# Patient Record
Sex: Female | Born: 1965 | Race: Black or African American | Hispanic: No | Marital: Married | Smoking: Never smoker
Health system: Southern US, Community
[De-identification: ages and names within clinical notes are randomized; demographics above are authoritative.]

## PROBLEM LIST (undated history)

## (undated) DIAGNOSIS — M549 Dorsalgia, unspecified: Secondary | ICD-10-CM

## (undated) DIAGNOSIS — E785 Hyperlipidemia, unspecified: Principal | ICD-10-CM

## (undated) DIAGNOSIS — M1990 Unspecified osteoarthritis, unspecified site: Secondary | ICD-10-CM

## (undated) DIAGNOSIS — F32A Depression, unspecified: Secondary | ICD-10-CM

## (undated) DIAGNOSIS — K219 Gastro-esophageal reflux disease without esophagitis: Secondary | ICD-10-CM

## (undated) DIAGNOSIS — G629 Polyneuropathy, unspecified: Secondary | ICD-10-CM

## (undated) DIAGNOSIS — F329 Major depressive disorder, single episode, unspecified: Secondary | ICD-10-CM

## (undated) DIAGNOSIS — G8929 Other chronic pain: Secondary | ICD-10-CM

## (undated) HISTORY — DX: Unspecified osteoarthritis, unspecified site: M19.90

## (undated) HISTORY — PX: BREAST SURGERY: SHX581

## (undated) HISTORY — PX: ACHILLES TENDON SURGERY: SHX542

## (undated) HISTORY — PX: NEUROPLASTY / TRANSPOSITION MEDIAN NERVE AT CARPAL TUNNEL BILATERAL: SUR894

## (undated) HISTORY — PX: CHOLECYSTECTOMY: SHX55

## (undated) HISTORY — PX: ABDOMINAL HYSTERECTOMY: SHX81

## (undated) HISTORY — DX: Hyperlipidemia, unspecified: E78.5

---

## 2003-09-23 ENCOUNTER — Emergency Department (HOSPITAL_COMMUNITY): Admission: EM | Admit: 2003-09-23 | Discharge: 2003-09-24 | Payer: Self-pay | Admitting: Emergency Medicine

## 2005-03-26 ENCOUNTER — Emergency Department (HOSPITAL_COMMUNITY): Admission: EM | Admit: 2005-03-26 | Discharge: 2005-03-27 | Payer: Self-pay | Admitting: Emergency Medicine

## 2005-04-03 ENCOUNTER — Emergency Department (HOSPITAL_COMMUNITY): Admission: EM | Admit: 2005-04-03 | Discharge: 2005-04-03 | Payer: Self-pay | Admitting: Emergency Medicine

## 2005-07-27 ENCOUNTER — Encounter: Admission: RE | Admit: 2005-07-27 | Discharge: 2005-10-25 | Payer: Self-pay | Admitting: Orthopedic Surgery

## 2005-09-10 ENCOUNTER — Encounter: Admission: RE | Admit: 2005-09-10 | Discharge: 2005-09-10 | Payer: Self-pay | Admitting: Orthopedic Surgery

## 2007-07-21 ENCOUNTER — Emergency Department (HOSPITAL_COMMUNITY): Admission: EM | Admit: 2007-07-21 | Discharge: 2007-07-21 | Payer: Self-pay | Admitting: Emergency Medicine

## 2009-01-31 ENCOUNTER — Emergency Department (HOSPITAL_COMMUNITY): Admission: EM | Admit: 2009-01-31 | Discharge: 2009-01-31 | Payer: Self-pay | Admitting: Family Medicine

## 2011-01-18 ENCOUNTER — Inpatient Hospital Stay (HOSPITAL_COMMUNITY)
Admission: EM | Admit: 2011-01-18 | Discharge: 2011-01-21 | DRG: 392 | Disposition: A | Discharge status: Home / Self Care | Attending: Internal Medicine | Admitting: Internal Medicine

## 2011-01-18 ENCOUNTER — Emergency Department (HOSPITAL_COMMUNITY)

## 2011-01-18 DIAGNOSIS — R109 Unspecified abdominal pain: Secondary | ICD-10-CM | POA: Diagnosis present

## 2011-01-18 DIAGNOSIS — K2990 Gastroduodenitis, unspecified, without bleeding: Principal | ICD-10-CM | POA: Diagnosis present

## 2011-01-18 DIAGNOSIS — K5900 Constipation, unspecified: Secondary | ICD-10-CM | POA: Diagnosis present

## 2011-01-18 DIAGNOSIS — R7309 Other abnormal glucose: Secondary | ICD-10-CM | POA: Diagnosis present

## 2011-01-18 DIAGNOSIS — K2970 Gastritis, unspecified, without bleeding: Principal | ICD-10-CM | POA: Diagnosis present

## 2011-01-18 LAB — CBC
MCH: 28.1 pg (ref 26.0–34.0)
MCHC: 33.1 g/dL (ref 30.0–36.0)
MCV: 84.8 fL (ref 78.0–100.0)
Platelets: 202 10*3/uL (ref 150–400)
RDW: 13.2 % (ref 11.5–15.5)

## 2011-01-18 LAB — DIFFERENTIAL
Basophils Absolute: 0 10*3/uL (ref 0.0–0.1)
Eosinophils Relative: 0 % (ref 0–5)
Lymphocytes Relative: 13 % (ref 12–46)
Lymphs Abs: 0.9 10*3/uL (ref 0.7–4.0)
Monocytes Absolute: 0.3 10*3/uL (ref 0.1–1.0)
Neutrophils Relative %: 84 % — ABNORMAL HIGH (ref 43–77)

## 2011-01-18 LAB — LIPASE, BLOOD: Lipase: 10 U/L — ABNORMAL LOW (ref 11–59)

## 2011-01-18 LAB — GLUCOSE, CAPILLARY: Glucose-Capillary: 107 mg/dL — ABNORMAL HIGH (ref 70–99)

## 2011-01-18 LAB — COMPREHENSIVE METABOLIC PANEL
ALT: 25 U/L (ref 0–35)
CO2: 25 mEq/L (ref 19–32)
Calcium: 9 mg/dL (ref 8.4–10.5)
Creatinine, Ser: 0.67 mg/dL (ref 0.4–1.2)
GFR calc Af Amer: >60 mL/min (ref >60)
GFR calc non Af Amer: >60 mL/min (ref >60)
Glucose, Bld: 109 mg/dL — ABNORMAL HIGH (ref 70–99)
Potassium: 3.5 mEq/L (ref 3.5–5.1)

## 2011-01-18 LAB — URINALYSIS, ROUTINE W REFLEX MICROSCOPIC
Hgb urine dipstick: NEGATIVE
Leukocytes, UA: NEGATIVE
Nitrite: NEGATIVE

## 2011-01-18 LAB — BASIC METABOLIC PANEL
Calcium: 9.6 mg/dL (ref 8.4–10.5)
Potassium: 3.8 mEq/L (ref 3.5–5.1)
Sodium: 137 mEq/L (ref 135–145)

## 2011-01-18 LAB — POCT PREGNANCY, URINE: Preg Test, Ur: NEGATIVE

## 2011-01-18 MED ORDER — IOHEXOL 300 MG/ML  SOLN
100.0000 mL | Freq: Once | INTRAMUSCULAR | Status: AC | PRN
  Administered 2011-01-18: 100 mL via INTRAVENOUS

## 2011-01-19 LAB — COMPREHENSIVE METABOLIC PANEL
Albumin: 3.6 g/dL (ref 3.5–5.2)
BUN: 14 mg/dL (ref 6–23)
CO2: 22 mEq/L (ref 19–32)
Chloride: 106 mEq/L (ref 96–112)
Creatinine, Ser: 0.59 mg/dL (ref 0.4–1.2)
Potassium: 3.6 mEq/L (ref 3.5–5.1)

## 2011-01-19 LAB — CBC
HCT: 35.6 % — ABNORMAL LOW (ref 36.0–46.0)
Hemoglobin: 11.4 g/dL — ABNORMAL LOW (ref 12.0–15.0)
MCHC: 32 g/dL (ref 30.0–36.0)
MCV: 86.4 fL (ref 78.0–100.0)
RDW: 13.4 % (ref 11.5–15.5)
WBC: 5.8 10*3/uL (ref 4.0–10.5)

## 2011-01-19 LAB — PHOSPHORUS: Phosphorus: 2.6 mg/dL (ref 2.3–4.6)

## 2011-01-19 LAB — MAGNESIUM: Magnesium: 1.9 mg/dL (ref 1.5–2.5)

## 2011-01-19 LAB — DIFFERENTIAL
Basophils Absolute: 0 10*3/uL (ref 0.0–0.1)
Eosinophils Absolute: 0 10*3/uL (ref 0.0–0.7)
Eosinophils Relative: 0 % (ref 0–5)
Lymphocytes Relative: 31 % (ref 12–46)
Lymphs Abs: 1.8 10*3/uL (ref 0.7–4.0)
Neutrophils Relative %: 58 % (ref 43–77)

## 2011-01-19 LAB — HEMOGLOBIN A1C
Hgb A1c MFr Bld: 5.5 % (ref <5.7)
Mean Plasma Glucose: 111 mg/dL (ref <117)

## 2011-01-30 NOTE — H&P (Signed)
[name], [name]                ACCOUNT NO.:  [id number]  MEDICAL RECORD NO.:  [id number]  LOCATION:  WLED                         FACILITY:  [facility]  PHYSICIAN:  Kathlen Mody, MD       DATE OF BIRTH:  1965-11-16  DATE OF ADMISSION:  01/18/2011 DATE OF DISCHARGE:                             HISTORY & PHYSICAL   PRIMARY CARE PHYSICIAN:  Tawni Millers, M.D.  CHIEF COMPLAINT:  Persistent nausea, vomiting, abdominal pain, and diarrhea since 1 day.  HISTORY OF PRESENT ILLNESS:  This is a 45 year old lady with no significant past medical history, came in this morning to [facility] ED complaining of nausea, vomiting, abdominal pain, and loose bowel movements since yesterday 3 p.m.  There is no history of hematemesis. No history of any bleeding per rectum.  Bowel movements are loose, not watery.  No history of fever.  No history of sick contacts.  The patient works at [employer] and does not report anybody sick at [employer]. The patient denies any history of travel.  Denies history of sick contacts.  The patient reports vomiting persistent watery yellowish to green, no hematemesis.  No history of cough, chest pain, shortness of breath, palpitations, or syncope.  No history of blurry vision.  History of occasional headaches since yesterday.  No history of any tingling or numbness anywhere in the body.  The patient had a CT abdomen and pelvis done in the ER, which was within normal limits.  The patient could not tolerate any p.o. intake and hospitalist service has been asked to admit the patient for observation and symptom control.  REVIEW OF SYSTEMS:  See HPI, otherwise negative.  PAST MEDICAL HISTORY:  None.  HOME MEDICATIONS:  Vitamin D tablet 1 tablet daily.  FAMILY HISTORY:  History of diabetes and hypertension in parents.  ALLERGIES:  Allergic to PERCOCET.  SOCIAL HISTORY:  The patient denies any smoking, EtOH, or recreational drug use.  The patient works at [employer].  PHYSICAL EXAMINATION:  VITAL SIGNS:  She is afebrile, temperature of 98.4, pulse of 84, respiratory rate 16, blood pressure 106/52, and saturating 98% on room air. GENERAL:  She is alert, afebrile, tired looking, comfortable, in no acute distress. HEENT:  Pupils are equal reacting to light.  No JVD.  Dry mucous membranes. CARDIOVASCULAR:  S1 and S2 heard.  Regular rate and rhythm. RESPIRATORY:  Chest is clear to auscultation bilaterally.  No wheezing or rhonchi. ABDOMEN:  Soft, mildly tender in the epigastric area.  Bowel sounds are heard.  No organomegaly felt. EXTREMITIES:  No pedal edema. NEUROLOGICAL:  The patient is able to move all extremities.  The patient is alert and oriented x3.  No focal deficits.  PERTINENT LABORATORY DATA:  The patient had a urine pregnancy test, which was negative.  Urinalysis shows negative nitrites or leukocyte. CBC within normal limits.  Basic metabolic panel significant for a glucose of 118.  RADIOLOGY:  The patient had a CT abdomen and pelvis, which shows post cholecystectomy and hysterectomy and simple left ovarian cyst.  No acute or specific findings.  ASSESSMENT AND PLAN:  This is a 44 year old lady with no significant  past medical history, works at [employer], is being admitted for: 1. Persistent nausea, vomiting, lower abdominal pain and epigastric     pain and loose bowel movements most likely viral gastroenteritis.     She will be admitted for symptom control.  We will start her on IV     fluids normal saline 100 cc/hour, IV Zofran 4 mg q.8 h. as needed.     Clear liquid diet now as she tolerates diet.  We will advance the     diet later tomorrow.  Pain medication with Tylenol as needed.  We     will also start the patient on p.o. Protonix.  We will send stool     for stool fecal WBCs to see if she has an infection. 2. Hyperglycemia.  She has a blood sugar of 118.  We will send a     hemoglobin A1c. 3. For deep venous  thrombosis prophylaxis, we will encourage the     patient to be ambulatory, to ambulate. 4. The patient is full code.          ______________________________ Kathlen Mody, MD     VA/MEDQ  D:  01/18/2011  T:  01/18/2011  Job:  [id number]  Electronically Signed by Kathlen Mody MD on 01/30/2011 02:28:17 AM

## 2011-01-30 NOTE — Discharge Summary (Signed)
  [name], [name]                ACCOUNT NO.:  [id number]  MEDICAL RECORD NO.:  [id number]  LOCATION:  1504                         FACILITY:  [facility]  PHYSICIAN:  Kathlen Mody, MD       DATE OF BIRTH:  16-Aug-1965  DATE OF ADMISSION:  01/18/2011 DATE OF DISCHARGE:  01/21/2011                              DISCHARGE SUMMARY   DISCHARGE DIAGNOSES: 1. Gastritis. 2. Constipation.  DISCHARGE MEDICATIONS: 1. Protonix 40 mg 1 tablet daily. 2. Tylenol 325 mg take 650 mg p.o. q.4 h. 3. Promethazine 12.5 mg tablet half a tablet every 6 hours as needed. 4. Vitamin D3 one tablet daily.  PERTINENT LABORATORY DATA:  On admission, the patient had a urine pregnancy test which was negative.  Urinalysis negative for nitrites and leukocytes.  CBC within normal limits.  Basic metabolic panel significant for a glucose of 118.  Comprehensive metabolic panel with LFTs within normal limits.  Lipase of 10.  Repeat CBC showed a hemoglobin of 11.4.  Comprehensive metabolic panel once again within normal limits.  TSH was 1.342, hemoglobin A1c was 5.5, phos was 2.6, mag was 1.9.  CT abdomen and pelvis shows features of post cholecystectomy and hysterectomy, simple left ovarian cyst.  No acute or specific findings.  CONSULTS:  None.  BRIEF HOSPITAL COURSE:  The patient is a 45 year old lady with no significant past medical history, admitted for episodes of nausea, vomiting and loose bowel movements.  On admission, she appeared dehydrated.  She was given IV fluids, IV Zofran/Phenergan as needed and she was also started on Protonix.  The patient's abdominal pain has improved.  Nausea and vomiting has resolved.  The patient did not have any bowel movements during her hospitalization.  So the patient is being discharged on p.o. Protonix 40 mg daily for about 4 weeks and Phenergan as needed for nausea.  Hyperglycemia.  She was found to have a blood sugar of 118, hemoglobin A1c was 5.5 and the patient is  not diabetic.  DISCHARGE PHYSICAL EXAMINATION:  VITAL SIGNS:  The patient's vitals on the day of discharge, temperature of 98.2, pulse of 61, blood pressure 112/66, saturating 96% on room air. GENERAL:  On exam, she is alert, afebrile, oriented x3, comfortable. CARDIOVASCULAR EXAM:  S1, S2 heard. RESPIRATORY EXAM:  Chest clear to auscultation bilaterally. ABDOMEN:  Soft, nontender, nondistended.  Bowel sounds heard. EXTREMITIES:  No pedal edema.  The patient is hemodynamically stable for discharge home.  FOLLOWUP AND RECOMMENDATIONS:  The patient is recommended to follow up with her PCP in about a week and also recommended to be referred to her gastroenterologist for possible outpatient upper endoscopy.  Until that time she is also recommended to avoid NSAIDS, over the counter ibuprofen, Aleve, naproxen, Advil.  She is also recommended to avoid caffeinated drinks.  DIET:  As tolerated.          ______________________________ Kathlen Mody, MD     VA/MEDQ  D:  01/21/2011  T:  01/21/2011  Job:  [id number]  Electronically Signed by Kathlen Mody MD on 01/30/2011 02:28:20 AM

## 2011-03-29 ENCOUNTER — Inpatient Hospital Stay (INDEPENDENT_AMBULATORY_CARE_PROVIDER_SITE_OTHER)
Admission: RE | Admit: 2011-03-29 | Discharge: 2011-03-29 | Disposition: A | Discharge status: Home / Self Care | Source: Ambulatory Visit | Attending: Emergency Medicine | Admitting: Emergency Medicine

## 2011-03-29 DIAGNOSIS — J019 Acute sinusitis, unspecified: Secondary | ICD-10-CM

## 2011-03-29 DIAGNOSIS — G44209 Tension-type headache, unspecified, not intractable: Secondary | ICD-10-CM

## 2011-03-29 DIAGNOSIS — J3489 Other specified disorders of nose and nasal sinuses: Secondary | ICD-10-CM

## 2011-11-15 LAB — HM PAP SMEAR: HM Pap smear: NORMAL

## 2012-08-17 ENCOUNTER — Emergency Department (HOSPITAL_COMMUNITY)
Admission: EM | Admit: 2012-08-17 | Discharge: 2012-08-17 | Disposition: A | Discharge status: Home / Self Care | Attending: Emergency Medicine | Admitting: Emergency Medicine

## 2012-08-17 DIAGNOSIS — R1110 Vomiting, unspecified: Secondary | ICD-10-CM | POA: Insufficient documentation

## 2012-08-17 DIAGNOSIS — K0889 Other specified disorders of teeth and supporting structures: Secondary | ICD-10-CM

## 2012-08-17 DIAGNOSIS — M542 Cervicalgia: Secondary | ICD-10-CM | POA: Insufficient documentation

## 2012-08-17 DIAGNOSIS — J029 Acute pharyngitis, unspecified: Secondary | ICD-10-CM | POA: Insufficient documentation

## 2012-08-17 DIAGNOSIS — K089 Disorder of teeth and supporting structures, unspecified: Secondary | ICD-10-CM | POA: Insufficient documentation

## 2012-08-17 MED ORDER — PENICILLIN V POTASSIUM 500 MG PO TABS: 500.0000 mg | ORAL_TABLET | Freq: Four times a day (QID) | ORAL | Status: AC

## 2012-08-17 MED ORDER — TRAMADOL HCL 50 MG PO TABS: 50.0000 mg | ORAL_TABLET | Freq: Four times a day (QID) | ORAL | Status: DC | PRN

## 2012-08-17 NOTE — ED Notes (Signed)
Pt states she has a ride home. 

## 2012-08-17 NOTE — ED Notes (Signed)
Pt c/o pain to lower L side of mouth. Pt with no acute distress. Pt states she has a dentist appt on Monday to have tooth pulled.

## 2012-08-17 NOTE — ED Provider Notes (Signed)
History  This chart was scribed for non-physician practitioner working with Raeford Razor, MD by Erskine Emery, ED Scribe. This patient was seen in room WTR7/WTR7 and the patient's care was started at 15:08.   CSN: [id number]  Arrival date & time 08/17/12  1426   First MD Initiated Contact with Patient 08/17/12 1508      No chief complaint on file.   (Consider location/radiation/quality/duration/timing/severity/associated sxs/prior Treatment) [name] is a 47 y.o. female who presents to the Emergency Department complaining of constant right-sided lower tooth pain since Thursday. Patient is a 47 y.o. female presenting with tooth pain. The history is provided by the patient. No language interpreter was used.  Dental PainThe primary symptoms include mouth pain and sore throat. Primary symptoms do not include dental injury, fever, shortness of breath or cough. The symptoms began 2 days ago. The symptoms are unchanged. The symptoms are new. The symptoms occur constantly.  The sore throat is not accompanied by trouble swallowing.  Additional symptoms do not include: gum swelling, gum tenderness, trismus, facial swelling and trouble swallowing.  Pt denies any associated fever, chills, right-sided facial swelling, difficulty swallowing, or decreased ROM of her neck but she reports some throat soreness, neck pain, and emesis associated with taking her husband's hydrocodone for the pain. Pt denies any tooth injury but reports she had a filling in that area about 2 years ago.  Pt has an appointment with her dentist on Monday to get the tooth pulled, she just needs some help with pain management for the weekend.  No past medical history on file.  No past surgical history on file.  No family history on file.  History  Substance Use Topics  . Smoking status: Not on file  . Smokeless tobacco: Not on file  . Alcohol Use: Not on file    OB History    No data available      Review of Systems    Constitutional: Negative for fever and chills.  HENT: Positive for sore throat, neck pain and dental problem. Negative for facial swelling, trouble swallowing and neck stiffness.   Respiratory: Negative for cough and shortness of breath.   Gastrointestinal: Negative for nausea and vomiting.  Neurological: Negative for weakness.    Allergies  Darvocet and Percocet  Home Medications   Current Outpatient Rx  Name  Route  Sig  Dispense  Refill  . VITAMIN D 1000 UNITS PO TABS   Oral   Take 1,000 Units by mouth daily.           There were no vitals taken for this visit.  Physical Exam  Nursing note and vitals reviewed. Constitutional: She is oriented to person, place, and time. She appears well-developed and well-nourished. No distress.  HENT:  Head: Normocephalic and atraumatic.  Mouth/Throat: Oropharynx is clear and moist. No oropharyngeal exudate.       No gingival swelling. No drainage. No tenderness to palpation of the gingiva. Tooth appears to be intact. No pulp or dentin exposed. Oropharynx is clear. No trismus. No facial swelling.  Eyes: EOM are normal. Pupils are equal, round, and reactive to light.  Neck: Neck supple. No tracheal deviation present.       No submental or submandibular lymphadenopathy. Full ROM of neck.  Cardiovascular: Normal rate, regular rhythm and normal heart sounds.   Pulmonary/Chest: Effort normal and breath sounds normal. No respiratory distress. She has no wheezes.  Abdominal: Soft. She exhibits no distension.  Musculoskeletal: Normal range of motion. She  exhibits no edema.  Neurological: She is alert and oriented to person, place, and time.  Skin: Skin is warm and dry.  Psychiatric: She has a normal mood and affect.    ED Course  Procedures (including critical care time) DIAGNOSTIC STUDIES: Oxygen Saturation is 99% on room air, normal by my interpretation.    COORDINATION OF CARE: 15:17--I evaluated the patient and we discussed a  treatment plan including ultram for pain to which the pt agreed.    Labs Reviewed - No data to display No results found.   No diagnosis found.    MDM  Patient with toothache.  No gross abscess.  Exam unconcerning for Ludwig's angina or spread of infection.  Will treat with penicillin and pain medicine.  Urged patient to follow-up with dentist.    I personally performed the services described in this documentation, which was scribed in my presence. The recorded information has been reviewed and is accurate.    Pascal Lux [place], PA-C 08/17/12 1530

## 2012-08-19 NOTE — ED Provider Notes (Signed)
Medical screening examination/treatment/procedure(s) were performed by non-physician practitioner and as supervising physician I was immediately available for consultation/collaboration.  Raeford Razor, MD 08/19/12 2104

## 2013-01-31 ENCOUNTER — Emergency Department (INDEPENDENT_AMBULATORY_CARE_PROVIDER_SITE_OTHER)
Admission: EM | Admit: 2013-01-31 | Discharge: 2013-01-31 | Disposition: A | Discharge status: Home / Self Care | Source: Home / Self Care | Attending: Family Medicine | Admitting: Family Medicine

## 2013-01-31 ENCOUNTER — Encounter (HOSPITAL_COMMUNITY): Payer: Self-pay | Admitting: Emergency Medicine

## 2013-01-31 DIAGNOSIS — J069 Acute upper respiratory infection, unspecified: Secondary | ICD-10-CM

## 2013-01-31 MED ORDER — IBUPROFEN 600 MG PO TABS: 600.0000 mg | ORAL_TABLET | Freq: Three times a day (TID) | ORAL | Status: DC | PRN

## 2013-01-31 MED ORDER — GUAIFENESIN-CODEINE 100-10 MG/5ML PO SYRP: 5.0000 mL | ORAL_SOLUTION | Freq: Three times a day (TID) | ORAL | Status: DC | PRN

## 2013-01-31 MED ORDER — CETIRIZINE-PSEUDOEPHEDRINE ER 5-120 MG PO TB12: 1.0000 | ORAL_TABLET | Freq: Two times a day (BID) | ORAL | Status: DC | PRN

## 2013-01-31 MED ORDER — BENZONATATE 100 MG PO CAPS: 100.0000 mg | ORAL_CAPSULE | Freq: Three times a day (TID) | ORAL | Status: DC

## 2013-01-31 NOTE — ED Notes (Signed)
Pt c/o cold/sinus sxs onset Wednesday... sxs include: ST, bilateral ear fullness, nasal congestion, cough w/streaks of blood, loss of voice, diarrhea... Denies: f/v/n, SOB, wheezing... Hx of sinus infections... She is alert and oriented w/no signs of acute distress.

## 2013-01-31 NOTE — ED Provider Notes (Signed)
History    CSN: [id number] Arrival date & time 01/31/13  1022  First MD Initiated Contact with Patient 01/31/13 1036     Chief Complaint  Patient presents with  . URI   (Consider location/radiation/quality/duration/timing/severity/associated sxs/prior Treatment) HPI Comments: 47 year old nondiabetic nonsmoker female here complaining of sore throat nasal congestion and general malaise for 3 days. Symptoms associated also with mild diarrhea on day #1. Also reports minimally productive frequent cough that presented blood strikes this morning. Denies shortness of breath wheezing or chest pain. No fever or chills. Appetite is okay. No headaches  History reviewed. No pertinent past medical history. History reviewed. No pertinent past surgical history. No family history on file. History  Substance Use Topics  . Smoking status: Never Smoker   . Smokeless tobacco: Not on file  . Alcohol Use: No   OB History   Grav Para Term Preterm Abortions TAB SAB Ect Mult Living                 Review of Systems  Constitutional: Negative for fever, chills, diaphoresis, appetite change and fatigue.  HENT: Positive for congestion, sore throat and voice change. Negative for trouble swallowing.   Respiratory: Positive for cough. Negative for shortness of breath and wheezing.   Gastrointestinal: Positive for diarrhea. Negative for nausea, vomiting and abdominal pain.  Skin: Negative for rash.  Neurological: Negative for dizziness and headaches.  All other systems reviewed and are negative.    Allergies  Darvocet; Latex; and Percocet  Home Medications   Current Outpatient Rx  Name  Route  Sig  Dispense  Refill  . cholecalciferol (VITAMIN D) 1000 UNITS tablet   Oral   Take 1,000 Units by mouth daily.         Marland Kitchen SIMVASTATIN PO   Oral   Take by mouth.         . benzonatate (TESSALON) 100 MG capsule   Oral   Take 1 capsule (100 mg total) by mouth every 8 (eight) hours.   21 capsule    0   . cetirizine-pseudoephedrine (ZYRTEC-D) 5-120 MG per tablet   Oral   Take 1 tablet by mouth 2 (two) times daily as needed for allergies or rhinitis.   30 tablet   0   . guaiFENesin-codeine (ROBITUSSIN AC) 100-10 MG/5ML syrup   Oral   Take 5 mLs by mouth 3 (three) times daily as needed for cough.   120 mL   0   . ibuprofen (ADVIL,MOTRIN) 600 MG tablet   Oral   Take 1 tablet (600 mg total) by mouth every 8 (eight) hours as needed for pain or fever. Take with food   30 tablet   0    BP 133/83  Pulse 79  Temp(Src) 98.2 F (36.8 C) (Oral)  Resp 17  SpO2 98% Physical Exam  Nursing note and vitals reviewed. Constitutional: She is oriented to person, place, and time. She appears well-developed and well-nourished. No distress.  HENT:  Mouth/Throat: Oropharynx is clear and moist. No oropharyngeal exudate.  Nasal Congestion with erythema and swelling of nasal turbinates, clear rhinorrhea. Mild pharyngeal erythema and swelling, no exudates. No uvula deviation. No trismus. TM's with increased vascular markings and clear fluid behind no dullness bilaterally no swelling or bulging  Eyes: Conjunctivae and EOM are normal. Pupils are equal, round, and reactive to light. Right eye exhibits no discharge. Left eye exhibits no discharge.  Neck: Neck supple.  Cardiovascular: Normal heart sounds.   Pulmonary/Chest: Breath sounds  normal.  Abdominal: Soft. There is no tenderness.  Lymphadenopathy:    She has no cervical adenopathy.  Neurological: She is alert and oriented to person, place, and time.  Skin: No rash noted. She is not diaphoretic.    ED Course  Procedures (including critical care time) Labs Reviewed  POCT RAPID STREP A ([facility] CARE ONLY)   No results found. 1. URI (upper respiratory infection)     MDM  Negative strep test. Prescribed Tessalon Perles, ibuprofen, cetirizine/pseudoephedrine and guaifenesin with codeine. Supportive care and red flags that should prompt  her return to medical attention discussed with patient and provided in writing.  Sharin Grave, MD 02/01/13 [id number]

## 2013-02-02 LAB — CULTURE, GROUP A STREP

## 2013-04-11 ENCOUNTER — Emergency Department (HOSPITAL_COMMUNITY): Payer: No Typology Code available for payment source

## 2013-04-11 ENCOUNTER — Emergency Department (HOSPITAL_COMMUNITY)
Admission: EM | Admit: 2013-04-11 | Discharge: 2013-04-11 | Disposition: A | Discharge status: Home / Self Care | Payer: No Typology Code available for payment source | Attending: Emergency Medicine | Admitting: Emergency Medicine

## 2013-04-11 ENCOUNTER — Encounter (HOSPITAL_COMMUNITY): Payer: Self-pay | Admitting: Emergency Medicine

## 2013-04-11 DIAGNOSIS — IMO0002 Reserved for concepts with insufficient information to code with codable children: Secondary | ICD-10-CM | POA: Insufficient documentation

## 2013-04-11 DIAGNOSIS — Y92410 Unspecified street and highway as the place of occurrence of the external cause: Secondary | ICD-10-CM | POA: Insufficient documentation

## 2013-04-11 DIAGNOSIS — R05 Cough: Secondary | ICD-10-CM | POA: Insufficient documentation

## 2013-04-11 DIAGNOSIS — Z91040 Latex allergy status: Secondary | ICD-10-CM | POA: Insufficient documentation

## 2013-04-11 DIAGNOSIS — Z79899 Other long term (current) drug therapy: Secondary | ICD-10-CM | POA: Insufficient documentation

## 2013-04-11 DIAGNOSIS — R059 Cough, unspecified: Secondary | ICD-10-CM | POA: Insufficient documentation

## 2013-04-11 DIAGNOSIS — Y9389 Activity, other specified: Secondary | ICD-10-CM | POA: Insufficient documentation

## 2013-04-11 DIAGNOSIS — S0990XA Unspecified injury of head, initial encounter: Secondary | ICD-10-CM | POA: Insufficient documentation

## 2013-04-11 DIAGNOSIS — M7918 Myalgia, other site: Secondary | ICD-10-CM

## 2013-04-11 MED ORDER — IBUPROFEN 800 MG PO TABS: 800.0000 mg | ORAL_TABLET | Freq: Three times a day (TID) | ORAL | Status: DC | PRN

## 2013-04-11 MED ORDER — DIAZEPAM 5 MG PO TABS
5.0000 mg | ORAL_TABLET | Freq: Once | ORAL | Status: AC
  Administered 2013-04-11: 5 mg via ORAL
  Filled 2013-04-11: qty 1

## 2013-04-11 MED ORDER — DIAZEPAM 5 MG PO TABS: 5.0000 mg | ORAL_TABLET | Freq: Four times a day (QID) | ORAL | Status: DC | PRN
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
## 2013-04-11 NOTE — ED Notes (Signed)
Patient ambulated to the bathroom gait steady
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
## 2013-04-14 ENCOUNTER — Emergency Department (HOSPITAL_COMMUNITY)
Admission: EM | Admit: 2013-04-14 | Discharge: 2013-04-14 | Disposition: A | Discharge status: Home / Self Care | Attending: Emergency Medicine | Admitting: Emergency Medicine

## 2013-04-14 ENCOUNTER — Encounter (HOSPITAL_COMMUNITY): Payer: Self-pay | Admitting: Emergency Medicine

## 2013-04-14 DIAGNOSIS — Z79899 Other long term (current) drug therapy: Secondary | ICD-10-CM | POA: Insufficient documentation

## 2013-04-14 DIAGNOSIS — Z91040 Latex allergy status: Secondary | ICD-10-CM | POA: Insufficient documentation

## 2013-04-14 DIAGNOSIS — M79609 Pain in unspecified limb: Secondary | ICD-10-CM | POA: Insufficient documentation

## 2013-04-14 DIAGNOSIS — M545 Low back pain, unspecified: Secondary | ICD-10-CM

## 2013-04-14 DIAGNOSIS — G8911 Acute pain due to trauma: Secondary | ICD-10-CM | POA: Insufficient documentation

## 2013-04-14 MED ORDER — TRAMADOL HCL 50 MG PO TABS: 50.0000 mg | ORAL_TABLET | Freq: Four times a day (QID) | ORAL | Status: DC | PRN

## 2013-04-14 MED ORDER — METHOCARBAMOL 500 MG PO TABS: 500.0000 mg | ORAL_TABLET | Freq: Two times a day (BID) | ORAL | Status: DC
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
## 2013-04-15 NOTE — ED Provider Notes (Signed)
Medical screening examination/treatment/procedure(s) were performed by non-physician practitioner and as supervising physician I was immediately available for consultation/collaboration.   Gavina Dildine T Kiyoko Mcguirt, MD 04/15/13 1736 

## 2013-04-15 NOTE — ED Provider Notes (Signed)
Medical screening examination/treatment/procedure(s) were performed by non-physician practitioner and as supervising physician I was immediately available for consultation/collaboration.  Keywon Mestre, MD 04/15/13 0945 

## 2013-11-18 ENCOUNTER — Emergency Department (INDEPENDENT_AMBULATORY_CARE_PROVIDER_SITE_OTHER)
Admission: EM | Admit: 2013-11-18 | Discharge: 2013-11-18 | Disposition: A | Discharge status: Home / Self Care | Source: Home / Self Care

## 2013-11-18 ENCOUNTER — Encounter (HOSPITAL_COMMUNITY): Payer: Self-pay | Admitting: Emergency Medicine

## 2013-11-18 DIAGNOSIS — S335XXA Sprain of ligaments of lumbar spine, initial encounter: Secondary | ICD-10-CM

## 2013-11-18 DIAGNOSIS — M545 Low back pain, unspecified: Secondary | ICD-10-CM

## 2013-11-18 DIAGNOSIS — M79605 Pain in left leg: Secondary | ICD-10-CM

## 2013-11-18 DIAGNOSIS — S39012A Strain of muscle, fascia and tendon of lower back, initial encounter: Secondary | ICD-10-CM

## 2013-11-18 MED ORDER — KETOROLAC TROMETHAMINE 30 MG/ML IJ SOLN
INTRAMUSCULAR | Status: AC
  Filled 2013-11-18: qty 1

## 2013-11-18 MED ORDER — HYDROCODONE-ACETAMINOPHEN 5-325 MG PO TABS: 1.0000 | ORAL_TABLET | ORAL | Status: DC | PRN

## 2013-11-18 MED ORDER — DICLOFENAC POTASSIUM 50 MG PO TABS: 50.0000 mg | ORAL_TABLET | Freq: Three times a day (TID) | ORAL | Status: DC

## 2013-11-18 MED ORDER — KETOROLAC TROMETHAMINE 60 MG/2ML IM SOLN
60.0000 mg | Freq: Once | INTRAMUSCULAR | Status: AC
Start: 2013-11-18 — End: 2013-11-18
  Administered 2013-11-18: 60 mg via INTRAMUSCULAR

## 2013-11-18 MED ORDER — CYCLOBENZAPRINE HCL 5 MG PO TABS: 5.0000 mg | ORAL_TABLET | Freq: Three times a day (TID) | ORAL | Status: DC | PRN

## 2013-11-18 MED ORDER — DICLOFENAC SODIUM 1 % TD GEL: 1.0000 "application " | Freq: Four times a day (QID) | TRANSDERMAL | Status: DC

## 2013-11-18 MED ORDER — KETOROLAC TROMETHAMINE 60 MG/2ML IM SOLN
INTRAMUSCULAR | Status: AC
  Filled 2013-11-18: qty 2

## 2013-11-18 NOTE — ED Provider Notes (Signed)
CSN: [id number]     Arrival date & time 11/18/13  [id number] History   First MD Initiated Contact with Patient 11/18/13 1121     Chief Complaint  Patient presents with  . Back Pain   (Consider location/radiation/quality/duration/timing/severity/associated sxs/prior Treatment) HPI Comments: Acute but rapid  progression of L low back pain over the past 4 d. Pain radiates to the knee and anterior thigh No known single injurious event. 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   History reviewed. No pertinent past medical history. Past Surgical History  Procedure Laterality Date  . Cholecystectomy    . Breast surgery    . Abdominal hysterectomy     No family history on file. History  Substance Use Topics  . Smoking status: Never Smoker   . Smokeless tobacco: Not on file  . Alcohol Use: No   OB History   Grav Para Term Preterm Abortions TAB SAB Ect Mult Living                 Review of Systems  Constitutional: Negative for fever, chills and activity change.  HENT: Negative.   Respiratory: Negative.   Cardiovascular: Negative.   Musculoskeletal: Positive for back pain.       As per HPI  Skin: Negative for rash.  Neurological: Negative.     Allergies  Darvocet; Percocet; Shrimp; and Latex  Home Medications   Prior to Admission medications   Medication Sig Start Date End Date Taking? Authorizing Provider  MELOXICAM PO Take by mouth.   Yes Historical Provider, MD  calcium carbonate (OS-CAL) 600 MG TABS tablet Take 600 mg by mouth 2 (two) times daily with a meal.    Historical Provider, MD  cholecalciferol (VITAMIN D) 1000 UNITS tablet Take 1,000 Units by mouth daily.    Historical Provider, MD  diazepam (VALIUM) 5 MG tablet Take 1 tablet (5 mg total) by mouth every 6 (six) hours as needed (muscle spasm or pain). 04/11/13   Trixie DredgeEmily West, PA-C  ibuprofen (ADVIL,MOTRIN) 800 MG tablet Take 1 tablet (800 mg total) by mouth every 8 (eight) hours as needed for pain. 04/11/13   Trixie DredgeEmily West,  PA-C  methocarbamol (ROBAXIN) 500 MG tablet Take 1 tablet (500 mg total) by mouth 2 (two) times daily. 04/14/13   Heather Laisure, PA-C  simvastatin (ZOCOR) 10 MG tablet Take 10 mg by mouth at bedtime.    Historical Provider, MD  traMADol (ULTRAM) 50 MG tablet Take 1 tablet (50 mg total) by mouth every 6 (six) hours as needed for pain. 04/14/13   Heather Laisure, PA-C   BP 101/55  Pulse 71  Temp(Src) 98.2 F (36.8 C) (Oral)  Resp 18  SpO2 100% Physical Exam  Nursing note and vitals reviewed. Constitutional: She is oriented to person, place, and time. She appears well-developed and well-nourished. No distress.  HENT:  Head: Normocephalic and atraumatic.  Eyes: EOM are normal.  Neck: Normal range of motion. Neck supple.  Pulmonary/Chest: Effort normal. No respiratory distress.  Musculoskeletal: She exhibits no edema.  Marked tenderness to light palpation to the L lower back and buttock musculature. Worse attempts at flexion. Difficult to get up and down to a seated position due to pain. No motor weakness except that due to pain. Mild thigh tenderness. Able to stand and bear full wt but mostly to the R leg due to the pain of L leg wt bearing. No spinal deformity, some tenderness.  Neurological: She is alert and oriented to person,  place, and time. No cranial nerve deficit.  Skin: Skin is warm and dry.    ED Course  Procedures (including critical care time) Labs Review Labs Reviewed - No data to display   Imaging Review No results found.   MDM   1. Strain of lumbar paraspinal muscle   2. Low back pain radiating to left leg    Heat Diclofenac gel norco cataflam tid Stretches as demo'd     Hayden Rasmussenavid Noelene Gang, NP 11/18/13 1220  Hayden Rasmussenavid Dawnetta Copenhaver, NP 11/18/13 1221
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
## 2013-11-18 NOTE — Discharge Instructions (Signed)
Back Pain, Adult Low back pain is very common. About 1 in 5 people have back pain.The cause of low back pain is rarely dangerous. The pain often gets better over time.About half of people with a sudden onset of back pain feel better in just 2 weeks. About 8 in 10 people feel better by 6 weeks.  CAUSES Some common causes of back pain include:  Strain of the muscles or ligaments supporting the spine.  Wear and tear (degeneration) of the spinal discs.  Arthritis.  Direct injury to the back. DIAGNOSIS Most of the time, the direct cause of low back pain is not known.However, back pain can be treated effectively even when the exact cause of the pain is unknown.Answering your caregiver's questions about your overall health and symptoms is one of the most accurate ways to make sure the cause of your pain is not dangerous. If your caregiver needs more information, he or she may order lab work or imaging tests (X-rays or MRIs).However, even if imaging tests show changes in your back, this usually does not require surgery. HOME CARE INSTRUCTIONS For many people, back pain returns.Since low back pain is rarely dangerous, it is often a condition that people can learn to [facility] their own.   Remain active. It is stressful on the back to sit or stand in one place. Do not sit, drive, or stand in one place for more than 30 minutes at a time. Take short walks on level surfaces as soon as pain allows.Try to increase the length of time you walk each day.  Do not stay in bed.Resting more than 1 or 2 days can delay your recovery.  Do not avoid exercise or work.Your body is made to move.It is not dangerous to be active, even though your back may hurt.Your back will likely heal faster if you return to being active before your pain is gone.  Pay attention to your body when you bend and lift. Many people have less discomfortwhen lifting if they bend their knees, keep the load close to their bodies,and  avoid twisting. Often, the most comfortable positions are those that put less stress on your recovering back.  Find a comfortable position to sleep. Use a firm mattress and lie on your side with your knees slightly bent. If you lie on your back, put a pillow under your knees.  Only take over-the-counter or prescription medicines as directed by your caregiver. Over-the-counter medicines to reduce pain and inflammation are often the most helpful.Your caregiver may prescribe muscle relaxant drugs.These medicines help dull your pain so you can more quickly return to your normal activities and healthy exercise.  Put ice on the injured area.  Put ice in a plastic bag.  Place a towel between your skin and the bag.  Leave the ice on for 15-20 minutes, 03-04 times a day for the first 2 to 3 days. After that, ice and heat may be alternated to reduce pain and spasms.  Ask your caregiver about trying back exercises and gentle massage. This may be of some benefit.  Avoid feeling anxious or stressed.Stress increases muscle tension and can worsen back pain.It is important to recognize when you are anxious or stressed and learn ways to manage it.Exercise is a great option. SEEK MEDICAL CARE IF:  You have pain that is not relieved with rest or medicine.  You have pain that does not improve in 1 week.  You have new symptoms.  You are generally not feeling well. SEEK  IMMEDIATE MEDICAL CARE IF:   You have pain that radiates from your back into your legs.  You develop new bowel or bladder control problems.  You have unusual weakness or numbness in your arms or legs.  You develop nausea or vomiting.  You develop abdominal pain.  You feel faint. Document Released: 07/24/2005 Document Revised: 01/23/2012 Document Reviewed: 12/12/2010 [facility] Patient Information 2014 [place]ExitCare, [place].  Lumbosacral Strain Lumbosacral strain is a strain of any of the parts that make up your lumbosacral  vertebrae. Your lumbosacral vertebrae are the bones that make up the lower third of your backbone. Your lumbosacral vertebrae are held together by muscles and tough, fibrous tissue (ligaments).  CAUSES  A sudden blow to your back can cause lumbosacral strain. Also, anything that causes an excessive stretch of the muscles in the low back can cause this strain. This is typically seen when people exert themselves strenuously, fall, lift heavy objects, bend, or crouch repeatedly. RISK FACTORS  Physically demanding work.  Participation in pushing or pulling sports or sports that require sudden twist of the back (tennis, golf, baseball).  Weight lifting.  Excessive lower back curvature.  Forward-tilted pelvis.  Weak back or abdominal muscles or both.  Tight hamstrings. SIGNS AND SYMPTOMS  Lumbosacral strain may cause pain in the area of your injury or pain that moves (radiates) down your leg.  DIAGNOSIS Your health care provider can often diagnose lumbosacral strain through a physical exam. In some cases, you may need tests such as X-ray exams.  TREATMENT  Treatment for your lower back injury depends on many factors that your clinician will have to evaluate. However, most treatment will include the use of anti-inflammatory medicines. HOME CARE INSTRUCTIONS   Avoid hard physical activities (tennis, racquetball, waterskiing) if you are not in proper physical condition for it. This may aggravate or create problems.  If you have a back problem, avoid sports requiring sudden body movements. Swimming and walking are generally safer activities.  Maintain good posture.  Maintain a healthy weight.  For acute conditions, you may put ice on the injured area.  Put ice in a plastic bag.  Place a towel between your skin and the bag.  Leave the ice on for 20 minutes, 2 3 times a day.  When the low back starts healing, stretching and strengthening exercises may be recommended. SEEK MEDICAL CARE  IF:  Your back pain is getting worse.  You experience severe back pain not relieved with medicines. SEEK IMMEDIATE MEDICAL CARE IF:   You have numbness, tingling, weakness, or problems with the use of your arms or legs.  There is a change in bowel or bladder control.  You have increasing pain in any area of the body, including your belly (abdomen).  You notice shortness of breath, dizziness, or feel faint.  You feel sick to your stomach (nauseous), are throwing up (vomiting), or become sweaty.  You notice discoloration of your toes or legs, or your feet get very cold. MAKE SURE YOU:   Understand these instructions.  Will watch your condition.  Will get help right away if you are not doing well or get worse. Document Released: 05/03/2005 Document Revised: 05/14/2013 Document Reviewed: 03/12/2013 [facility] Patient Information 2014 [place]ExitCare, [place]LLC.  Muscle Strain A muscle strain is an injury that occurs when a muscle is stretched beyond its normal length. Usually a small number of muscle fibers are torn when this happens. Muscle strain is rated in degrees. First-degree strains have the least amount of  muscle fiber tearing and pain. Second-degree and third-degree strains have increasingly more tearing and pain.  Usually, recovery from muscle strain takes 1 2 weeks. Complete healing takes 5 6 weeks.  CAUSES  Muscle strain happens when a sudden, violent force placed on a muscle stretches it too far. This may occur with lifting, sports, or a fall.  RISK FACTORS Muscle strain is especially common in athletes.  SIGNS AND SYMPTOMS At the site of the muscle strain, there may be:  Pain.  Bruising.  Swelling.  Difficulty using the muscle due to pain or lack of normal function. DIAGNOSIS  Your health care provider will perform a physical exam and ask about your medical history. TREATMENT  Often, the best treatment for a muscle strain is resting, icing, and applying cold compresses to  the injured area.  HOME CARE INSTRUCTIONS   Use the PRICE method of treatment to promote muscle healing during the first 2 3 days after your injury. The PRICE method involves:  Protecting the muscle from being injured again.  Restricting your activity and resting the injured body part.  Icing your injury. To do this, put ice in a plastic bag. Place a towel between your skin and the bag. Then, apply the ice and leave it on from 15 20 minutes each hour. After the third day, switch to moist heat packs.  Apply compression to the injured area with a splint or elastic bandage. Be careful not to wrap it too tightly. This may interfere with blood circulation or increase swelling.  Elevate the injured body part above the level of your heart as often as you can.  Only take over-the-counter or prescription medicines for pain, discomfort, or fever as directed by your health care provider.  Warming up prior to exercise helps to prevent future muscle strains. SEEK MEDICAL CARE IF:   You have increasing pain or swelling in the injured area.  You have numbness, tingling, or a significant loss of strength in the injured area. MAKE SURE YOU:   Understand these instructions.  Will watch your condition.  Will get help right away if you are not doing well or get worse. Document Released: 07/24/2005 Document Revised: 05/14/2013 Document Reviewed: 02/20/2013 [facility]Exit[facility] Patient Information 2014 [place]Exit[place], [place]LLC.

## 2013-11-21 NOTE — ED Provider Notes (Signed)
Medical screening examination/treatment/procedure(s) were performed by resident physician or non-physician practitioner and as supervising physician I was immediately available for consultation/collaboration.   Christiane Sistare DOUGLAS MD.   Allen Egerton D Corean Yoshimura, MD 11/21/13 1219 

## 2013-12-23 LAB — HM MAMMOGRAPHY: HM MAMMO: NORMAL

## 2014-03-03 ENCOUNTER — Encounter: Payer: Self-pay | Admitting: Family Medicine

## 2014-03-03 ENCOUNTER — Ambulatory Visit (INDEPENDENT_AMBULATORY_CARE_PROVIDER_SITE_OTHER): Admitting: Family Medicine

## 2014-03-03 VITALS — BP 120/80 | HR 82 | Temp 98.2°F | Resp 20 | Ht 64.5 in | Wt 211.0 lb

## 2014-03-03 DIAGNOSIS — R5383 Other fatigue: Secondary | ICD-10-CM

## 2014-03-03 DIAGNOSIS — R5381 Other malaise: Secondary | ICD-10-CM

## 2014-03-03 DIAGNOSIS — J309 Allergic rhinitis, unspecified: Secondary | ICD-10-CM

## 2014-03-03 DIAGNOSIS — M1990 Unspecified osteoarthritis, unspecified site: Secondary | ICD-10-CM | POA: Insufficient documentation

## 2014-03-03 DIAGNOSIS — J302 Other seasonal allergic rhinitis: Secondary | ICD-10-CM | POA: Insufficient documentation

## 2014-03-03 DIAGNOSIS — E785 Hyperlipidemia, unspecified: Secondary | ICD-10-CM

## 2014-03-03 DIAGNOSIS — E669 Obesity, unspecified: Secondary | ICD-10-CM

## 2014-03-03 DIAGNOSIS — M129 Arthropathy, unspecified: Secondary | ICD-10-CM

## 2014-03-03 HISTORY — DX: Hyperlipidemia, unspecified: E78.5

## 2014-03-03 LAB — LIPID PANEL
CHOL/HDL RATIO: 5
CHOLESTEROL: 151 mg/dL (ref 0–200)
HDL: 29.7 mg/dL — AB (ref >39.00)
NonHDL: 121.3
VLDL: 87.2 mg/dL — AB (ref 0.0–40.0)

## 2014-03-03 LAB — COMPREHENSIVE METABOLIC PANEL
ALBUMIN: 4.3 g/dL (ref 3.5–5.2)
ALK PHOS: 41 U/L (ref 39–117)
ALT: 21 U/L (ref 0–35)
AST: 20 U/L (ref 0–37)
BILIRUBIN TOTAL: 0.6 mg/dL (ref 0.2–1.2)
BUN: 19 mg/dL (ref 6–23)
CO2: 27 mEq/L (ref 19–32)
Calcium: 9.6 mg/dL (ref 8.4–10.5)
Chloride: 107 mEq/L (ref 96–112)
Creatinine, Ser: 0.7 mg/dL (ref 0.4–1.2)
GFR: 109.77 mL/min (ref >60.00)
Glucose, Bld: 94 mg/dL (ref 70–99)
POTASSIUM: 3.8 meq/L (ref 3.5–5.1)
Sodium: 137 mEq/L (ref 135–145)
Total Protein: 7.6 g/dL (ref 6.0–8.3)

## 2014-03-03 LAB — CBC
HEMATOCRIT: 38 % (ref 36.0–46.0)
Hemoglobin: 12.7 g/dL (ref 12.0–15.0)
MCHC: 33.4 g/dL (ref 30.0–36.0)
MCV: 87 fl (ref 78.0–100.0)
PLATELETS: 224 10*3/uL (ref 150.0–400.0)
RBC: 4.37 Mil/uL (ref 3.87–5.11)
RDW: 13.6 % (ref 11.5–15.5)
WBC: 5.4 10*3/uL (ref 4.0–10.5)

## 2014-03-03 LAB — LDL CHOLESTEROL, DIRECT: Direct LDL: 68.3 mg/dL

## 2014-03-03 LAB — TSH: TSH: 1 u[IU]/mL (ref 0.35–4.50)

## 2014-03-03 MED ORDER — MONTELUKAST SODIUM 10 MG PO TABS: 10.0000 mg | ORAL_TABLET | Freq: Every day | ORAL | Status: AC

## 2014-03-03 MED ORDER — MELOXICAM 15 MG PO TABS: 15.0000 mg | ORAL_TABLET | Freq: Every day | ORAL | Status: DC | PRN

## 2014-03-03 NOTE — Assessment & Plan Note (Signed)
Did not tolerate flonase due to nasal irritation. No improvement on zyrtec previously or claritin per patient. Will continue singulair.

## 2014-03-03 NOTE — Patient Instructions (Addendum)
High cholesterol-check labs today, will send message through mychart (sign up) or call Orders Placed This Encounter  Procedures  . CBC    [place]  . Comprehensive metabolic panel    [place]  . TSH    [place]  . Lipid panel    [place]     Arthritis- please stop at front desk with medical records so we can get records from [place]. Call in 1-2 weeks to see if we have records then schedule appointment if we do.   Also ask to get records from your former practice.   Meds ordered this encounter  Medications  . meloxicam (MOBIC) 15 MG tablet    Sig: Take 1 tablet (15 mg total) by mouth daily as needed for pain.    Dispense:  30 tablet    Refill:  3  . montelukast (SINGULAIR) 10 MG tablet    Sig: Take 1 tablet (10 mg total) by mouth at bedtime.    Dispense:  30 tablet    Refill:  11  Wait on mobic until after the labs  My 5 to Fitness!  5: fruits and vegetables per day (work on 9 per day if you are at 5) 4: exercise 4-5 times per week for at least 30 minutes (walking counts!) 150 minutes per week exercise 3: meals per day (don't skip breakfast!) 2: habits to quit -smoking -excess alcohol use (men >2 beer/day; women >1beer/day) 1: sweet per day (2 cookies, 1 small cup of ice cream, 12 oz soda)  These are general tips for healthy living. Try to start with 1 or 2 habit TODAY and make it a part of your life for several months.  Once you have 1 or 2 habits down for several months, try to begin working on your next healthy habit. With every single step you take, you will be leading a healthier lifestyle!

## 2014-03-03 NOTE — Assessment & Plan Note (Signed)
Check lipids today. Off statin for 1 month so suspect will be elevated.

## 2014-03-03 NOTE — Progress Notes (Signed)
Pre visit review using our clinic review tool, if applicable. No additional management support is needed unless otherwise documented below in the visit note. 

## 2014-03-03 NOTE — Progress Notes (Addendum)
Tana Conch, MD Phone: [phone]  Subjective:  Patient presents today to establish care. Chief complaint-noted.   Hyperlipidemia On statin: previously on simvastatin, off for 1 month Regular exercise: Walk 2x a week 30-40 minutes ROS- no chest pain or shortness of breath. No myalgias. Patient does complain of some low back pain radiating into her leg (discussed plan to get records from [place] and follow up on issue). No fecal or urinary incontinence. Also some foot pain.   Seasonal allergies Well controlled on singulair.  ROS- no watery, itchy eyes or sneezing. Full 12 point review of systems completed and negative except as noted above.   Had tetanus last year. Will get records from previous office and have chart updated.   The following were reviewed and entered/updated in epic: Past Medical History  Diagnosis Date  . Arthritis   . Hyperlipidemia 03/03/2014   Patient Active Problem List   Diagnosis Date Noted  . Hyperlipidemia 03/03/2014    Priority: Medium  . Arthritis 03/03/2014    Priority: Low  . Seasonal allergies 03/03/2014   Past Surgical History  Procedure Laterality Date  . Cholecystectomy    . Breast surgery      nodule in breast removed-not cancer  . Abdominal hysterectomy      partial-still has ovaries. Benign reason. Unsure about cervix     Family History  Problem Relation Age of Onset  . Hypertension Mother   . Hypertension Father   . Diabetes Father   . Cancer Paternal Grandmother     unsure  . Cancer Paternal Grandfather     prostate    Medications- reviewed and updated Current Outpatient Prescriptions  Medication Sig Dispense Refill  . calcium carbonate (OS-CAL) 600 MG TABS tablet Take 600 mg by mouth 2 (two) times daily with a meal.      . cholecalciferol (VITAMIN D) 1000 UNITS tablet Take 2,000 Units by mouth daily.       . cyclobenzaprine (FLEXERIL) 5 MG tablet Take 1 tablet (5 mg total) by mouth 3 (three) times daily as needed for  muscle spasms.  20 tablet  0  . meloxicam (MOBIC) 15 MG tablet Take 1 tablet (15 mg total) by mouth daily as needed for pain.  30 tablet  3  . montelukast (SINGULAIR) 10 MG tablet Take 1 tablet (10 mg total) by mouth at bedtime.  30 tablet  11  . simvastatin (ZOCOR) 10 MG tablet Take 10 mg by mouth at bedtime.       No current facility-administered medications for this visit.    Allergies-reviewed and updated Allergies  Allergen Reactions  . Darvocet [Propoxyphene N-Acetaminophen] Nausea And Vomiting  . Percocet [Oxycodone-Acetaminophen] Nausea And Vomiting  . Shrimp [Shellfish Allergy] Hives and Itching  . Latex Itching and Rash    History   Social History  . Marital Status: Married    Spouse Name: N/A    Number of Children: N/A  . Years of Education: N/A   Social History Main Topics  . Smoking status: Never Smoker   . Smokeless tobacco: None  . Alcohol Use: No  . Drug Use: No  . Sexual Activity: Yes   Other Topics Concern  . None   Social History Narrative   Married 21 years in 2015. Lives with 5 people. 2 kids. No grandkids. Husband is a Education officer, environmental. 3 pregnancies and 2 live births   Owns daycare for 8 years, drive a bus, and Geologist, engineering for [employer], basketball, walking,  reading Bible, people person   Fredderick SeveranceJohn Tipton MD [facility] past MD-out of network now.                 ROS--See HPI   Objective: BP 120/80  Pulse 82  Temp(Src) 98.2 F (36.8 C) (Oral)  Resp 20  Ht 5' 4.5" (1.638 m)  Wt 211 lb (95.709 kg)  BMI 35.67 kg/m2  SpO2 98% Gen: NAD, resting comfortably on chair HEENT: Mucous membranes are moist. Oropharynx normal Breasts: normal appearance, no masses or tenderness Neck: no thyromegaly CV: RRR no murmurs rubs or gallops Lungs: CTAB no crackles, wheeze, rhonchi Abdomen: soft/nontender/nondistended/normal bowel sounds. No rebound or guarding.  Ext: no edema Skin: warm, dry, no rash Neuro: grossly normal, moves all  extremities, PERRLA  Assessment/Plan:  Hyperlipidemia Check lipids today. Off statin for 1 month so suspect will be elevated.   Seasonal allergies Did not tolerate flonase due to nasal irritation. No improvement on zyrtec previously or claritin per patient. Will continue singulair.    Needs to increase sleep-5 hours per night. .   Orders Placed This Encounter  Procedures  . CBC    [place]  . Comprehensive metabolic panel    [place]  . TSH    [place]  . Lipid panel    [place]  . LDL cholesterol, direct   Refilled mobic for back pain, patient will wait to take until creatine checked Meds ordered this encounter  Medications  . meloxicam (MOBIC) 15 MG tablet    Sig: Take 1 tablet (15 mg total) by mouth daily as needed for pain.    Dispense:  30 tablet    Refill:  3  . montelukast (SINGULAIR) 10 MG tablet    Sig: Take 1 tablet (10 mg total) by mouth at bedtime.    Dispense:  30 tablet    Refill:  11

## 2014-03-04 ENCOUNTER — Telehealth: Payer: Self-pay | Admitting: Family Medicine

## 2014-03-04 MED ORDER — SIMVASTATIN 40 MG PO TABS: 40.0000 mg | ORAL_TABLET | Freq: Every day | ORAL | Status: DC

## 2014-03-04 NOTE — Telephone Encounter (Signed)
Rx for simvastatin sent in. Sent patient mychart message with explanation. Please inform patient.

## 2014-03-04 NOTE — Telephone Encounter (Signed)
Left message on pt VM informing her that her Rx has been called in

## 2014-04-03 ENCOUNTER — Encounter: Payer: Self-pay | Admitting: Family Medicine

## 2014-04-03 DIAGNOSIS — R51 Headache: Secondary | ICD-10-CM | POA: Insufficient documentation

## 2014-04-03 DIAGNOSIS — R519 Headache, unspecified: Secondary | ICD-10-CM | POA: Insufficient documentation

## 2014-04-03 DIAGNOSIS — N644 Mastodynia: Secondary | ICD-10-CM | POA: Insufficient documentation

## 2014-04-03 DIAGNOSIS — E781 Pure hyperglyceridemia: Secondary | ICD-10-CM | POA: Insufficient documentation

## 2014-04-03 DIAGNOSIS — G4700 Insomnia, unspecified: Secondary | ICD-10-CM | POA: Insufficient documentation

## 2014-04-03 DIAGNOSIS — E669 Obesity, unspecified: Secondary | ICD-10-CM | POA: Insufficient documentation

## 2014-04-06 ENCOUNTER — Encounter: Payer: Self-pay | Admitting: Family Medicine

## 2014-04-10 ENCOUNTER — Encounter: Payer: Self-pay | Admitting: Family Medicine

## 2014-04-10 ENCOUNTER — Ambulatory Visit (INDEPENDENT_AMBULATORY_CARE_PROVIDER_SITE_OTHER): Admitting: Family Medicine

## 2014-04-10 VITALS — BP 122/98 | HR 80 | Temp 98.1°F | Wt 211.0 lb

## 2014-04-10 DIAGNOSIS — M129 Arthropathy, unspecified: Secondary | ICD-10-CM

## 2014-04-10 DIAGNOSIS — M791 Myalgia, unspecified site: Secondary | ICD-10-CM | POA: Insufficient documentation

## 2014-04-10 DIAGNOSIS — G609 Hereditary and idiopathic neuropathy, unspecified: Secondary | ICD-10-CM | POA: Diagnosis not present

## 2014-04-10 DIAGNOSIS — M1990 Unspecified osteoarthritis, unspecified site: Secondary | ICD-10-CM

## 2014-04-10 LAB — HEMOGLOBIN A1C: HEMOGLOBIN A1C: 5.6 % (ref 4.6–6.5)

## 2014-04-10 LAB — VITAMIN B12: VITAMIN B 12: 321 pg/mL (ref 211–911)

## 2014-04-10 LAB — TSH: TSH: 0.35 u[IU]/mL (ref 0.35–4.50)

## 2014-04-10 LAB — SEDIMENTATION RATE: Sed Rate: 15 mm/hr (ref 0–22)

## 2014-04-10 NOTE — Assessment & Plan Note (Addendum)
Discussed need for weight loss given DDD of low back. Discussed category 2 obesity. Patient wants to focus on foot pain first and address back pain later. She does have pain over SI joint and pain with faber so ultimately will refer for PT.

## 2014-04-10 NOTE — Assessment & Plan Note (Addendum)
Burning in feet for years, worse since starting back at work. Labs as below. If unrevealing consider referral to neurology vs trial gabapentin. I do not believe this is claudication or coming from her back as centers in bottom of her feet and radiation is rare from back. No current plans for MRI back. I did have difficulty with knee reflexes so lean toward neurology referral.

## 2014-04-10 NOTE — Patient Instructions (Addendum)
Labs to look for underlying cause.   If no cause found, could consider referral to neurology for EMG vs. Trial of gabapentin  The long term plan for back pain is that we need to help you lose weight. Exercise I understand is hard currently with pain in feet. Your homework is to see the tips below and pick 2 to work on. Once we get the foot pain figured out, we can send you for physical therapy  My 5 to Fitness!  5: fruits and vegetables per day (work on 9 per day if you are at 5) 4: exercise 4-5 times per week for at least 30 minutes (walking counts!) 3: meals per day (don't skip breakfast!) 2: habits to quit -smoking -excess alcohol use (men >2 beer/day; women >1beer/day) 1: sweet per day (2 cookies, 1 small cup of ice cream, 12 oz soda)  These are general tips for healthy living. Try to start with 1 or 2 habit TODAY and make it a part of your life for several months.  Once you have 1 or 2 habits down for several months, try to begin working on your next healthy habit. With every single step you take, you will be leading a healthier lifestyle!
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
## 2014-04-11 LAB — RPR

## 2014-04-11 LAB — RHEUMATOID FACTOR: Rhuematoid fact SerPl-aCnc: <10 IU/mL (ref <=14)

## 2014-04-11 LAB — HIV ANTIBODY (ROUTINE TESTING W REFLEX): HIV 1&2 Ab, 4th Generation: NONREACTIVE

## 2014-04-14 ENCOUNTER — Telehealth: Payer: Self-pay | Admitting: Family Medicine

## 2014-04-14 LAB — ANA: ANA: NEGATIVE

## 2014-04-14 NOTE — Telephone Encounter (Signed)
Pt would results of labs done fri.

## 2014-04-15 ENCOUNTER — Other Ambulatory Visit: Payer: Self-pay

## 2014-04-15 ENCOUNTER — Telehealth: Payer: Self-pay | Admitting: Family Medicine

## 2014-04-15 LAB — PROTEIN ELECTROPHORESIS, SERUM
ALPHA-2-GLOBULIN: 9.8 % (ref 7.1–11.8)
Albumin ELP: 57.9 % (ref 55.8–66.1)
Alpha-1-Globulin: 3.3 % (ref 2.9–4.9)
BETA 2: 5.7 % (ref 3.2–6.5)
BETA GLOBULIN: 5.8 % (ref 4.7–7.2)
GAMMA GLOBULIN: 17.5 % (ref 11.1–18.8)
Total Protein, Serum Electrophoresis: 7.5 g/dL (ref 6.0–8.3)

## 2014-04-15 MED ORDER — GABAPENTIN 100 MG PO CAPS: ORAL_CAPSULE | ORAL | Status: DC

## 2014-04-15 NOTE — Telephone Encounter (Signed)
See lab results note.

## 2014-04-15 NOTE — Telephone Encounter (Signed)
Dr. Durene Cal have you reviewed her lab results?

## 2014-04-15 NOTE — Telephone Encounter (Signed)
Ask patient to see me in about 3 weeks after starting medicine. She can go back 1 pill if side effects such as sleepiness are too great.

## 2014-04-15 NOTE — Telephone Encounter (Signed)
Message copied by Shelva Majestic on Wed Apr 15, 2014  4:39 PM ------      Message from: Lieutenant Diego A      Created: Wed Apr 15, 2014  4:11 PM       Pt states she would like to try the trial of Gabapentin first ------

## 2014-04-16 NOTE — Telephone Encounter (Signed)
[name]: Pt would like to discuss her med simvastatin (ZOCOR) 40 MG tablet And poss side effects of that med.

## 2014-04-16 NOTE — Telephone Encounter (Signed)
Advised pt to not take the Simvistatin for two days then take a 1/2 pill after that 2 day break and follow up with [place] within 2 weeks

## 2014-04-16 NOTE — Telephone Encounter (Signed)
Pt states she is having bad sweats at night and during the day and she thinks this is coming from the dosage of her Simvistatin being increased. She states she has been on this medication for a while and this has never happened until she saw you and it is inteferring with her sleep habits

## 2014-04-20 ENCOUNTER — Telehealth: Payer: Self-pay

## 2014-04-20 DIAGNOSIS — M79606 Pain in leg, unspecified: Secondary | ICD-10-CM

## 2014-04-20 NOTE — Telephone Encounter (Signed)
Pt states that she started taking Gabapentin on Friday and went and bought new shoes. She states there is really something wrong with her legs and feet b/c the pain shoots from her feet up to her legs and thighs. She states that she does not know what it is but she is getting concerend. Please advise

## 2014-04-20 NOTE — Telephone Encounter (Signed)
We had offered patient referral to neurology. [name], you can place this if patient so desires.

## 2014-04-21 NOTE — Telephone Encounter (Signed)
Referral put in.

## 2014-05-08 ENCOUNTER — Ambulatory Visit (INDEPENDENT_AMBULATORY_CARE_PROVIDER_SITE_OTHER): Admitting: Family Medicine

## 2014-05-08 ENCOUNTER — Encounter: Payer: Self-pay | Admitting: Family Medicine

## 2014-05-08 ENCOUNTER — Telehealth: Payer: Self-pay | Admitting: Family Medicine

## 2014-05-08 VITALS — BP 120/84 | HR 97 | Temp 98.4°F | Wt 211.0 lb

## 2014-05-08 DIAGNOSIS — M545 Low back pain: Secondary | ICD-10-CM

## 2014-05-08 MED ORDER — PREDNISONE 10 MG PO TABS: ORAL_TABLET | ORAL | Status: DC

## 2014-05-08 NOTE — Progress Notes (Signed)
Pre visit review using our clinic review tool, if applicable. No additional management support is needed unless otherwise documented below in the visit note. 

## 2014-05-08 NOTE — Patient Instructions (Signed)
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
## 2014-05-08 NOTE — Telephone Encounter (Signed)
Patient Information:  Caller Name: Tobe SosSolange  Phone: [phone]  Patient: [name], [name]  Gender: Female  DOB: 1966/04/20  Age: 48 Years  PCP: Tana ConchHunter, Stephen  Pregnant: No  Office Follow Up:  Does the office need to follow up with this patient?: No  Instructions For The Office: N/A   Symptoms  Reason For Call & Symptoms: Pt woke up with disabling back pain. No known injury. The pain is shooting down both legs.  Reviewed Health History In EMR: Yes  Reviewed Medications In EMR: Yes  Reviewed Allergies In EMR: Yes  Reviewed Surgeries / Procedures: Yes  Date of Onset of Symptoms: 05/08/2014 OB / GYN:  LMP: Unknown  Guideline(s) Used:  Back Pain  Disposition Per Guideline:   Go to Office Now  Reason For Disposition Reached:   Severe back pain  Advice Given:  N/A  Patient Will Follow Care Advice:  YES/pt cannot come to the office now. REfuses earlier appts. Requested last appt of the day for her work schedule.  Appointment Scheduled:  05/08/2014 16:30:00 Appointment Scheduled Provider:  Evelena PeatBurchette, Bruce [facility](Family Practice)

## 2014-05-08 NOTE — Telephone Encounter (Signed)
Noted  

## 2014-05-28 ENCOUNTER — Ambulatory Visit (INDEPENDENT_AMBULATORY_CARE_PROVIDER_SITE_OTHER): Admitting: Neurology

## 2014-05-28 ENCOUNTER — Encounter: Payer: Self-pay | Admitting: Neurology

## 2014-05-28 VITALS — BP 120/88 | HR 86 | Ht 63.0 in | Wt 207.4 lb

## 2014-05-28 DIAGNOSIS — M545 Low back pain, unspecified: Secondary | ICD-10-CM

## 2014-05-28 DIAGNOSIS — M79606 Pain in leg, unspecified: Secondary | ICD-10-CM

## 2014-05-28 DIAGNOSIS — R202 Paresthesia of skin: Secondary | ICD-10-CM

## 2014-05-28 NOTE — Progress Notes (Signed)
Done

## 2014-05-28 NOTE — Progress Notes (Signed)
[facility] Clinic Note - Initial Visit   Date: 05/28/2014  [name] MRN: [id number] DOB: 12-30-1965   Dear Dr. Yong Channel:  Thank you for your kind referral of [name] for consultation of bilateral feet pain. Although her history is well known to you, please allow [place] to reiterate it for the purpose of our medical record. The patient was accompanied to the clinic by self    History of Present Illness: [name] is a 48 y.o. right-handed [place] American female with history of hyperlipidemia, bilateral CTS s/p release, and allergies presenting for evaluation of bilateral feet pain.    Starting 2013, she developed intermittent burning, stabbing pain of her feet and lower legs.  Symptoms worsened last year because they are now constant.  It predominately affects the soles of her feet and is worse with prolonged standing and bending.  It is alleviated by rest.  She has a harder time getting up out of a chair.  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  She was evaluated by her PCP who ordered laboratory studies for neuropathy which returned normal (see below).    He also reports long history of low back pain.  She injured her back while working at a [employer] in 1996.  She has occasional radiating pain into her legs.  In the past, she had epidural injection which helps her pain.  Her last MRI lumbar spine was in 2007 which showed small disc protrusion at L5-S1 with mild displacement of the right S1 nerve.  She denies any weakness or falls.  She has generalized fatigue.    Out-side paper records, electronic medical record, and images have been reviewed where available and summarized as:  MRI cervical spine wo contrast 09/10/2005: 1. Left paracentral protrusion at C4-5 with mild cord flattening. No definite C-5 nerve root encroachment; canal diameter measures 7  mm at its most narrow dimension.  2. Mild straightening of the cervical spine.  MRI lumbar spine wo contrast 09/10/2005: 1. Small right paracentral disc protrusion L5-S1 with mild displacement of the right S-1 nerve root.  2. Lower lumbar facet arthropathy worst at L4-5.  Labs 05/28/2014:  HbA1c 5.6, vitamin B12 321, ANA negative, RF <10, ESR 15, TSH 0.35, RPR NR, HIV NR, SPEP with IFE no M protein   Past Medical History  Diagnosis Date  . Arthritis   . Hyperlipidemia 03/03/2014    Past Surgical History  Procedure Laterality Date  . Cholecystectomy    . Breast surgery      nodule in breast removed-not cancer  . Abdominal hysterectomy      partial-still has ovaries. Benign reason. Unsure about cervix   . Neuroplasty / transposition median nerve at carpal tunnel bilateral Bilateral      Medications:  Current Outpatient Prescriptions on File Prior to Visit  Medication Sig Dispense Refill  . calcium carbonate (OS-CAL) 600 MG TABS tablet Take 600 mg by mouth 2 (two) times daily with a meal.      . cholecalciferol (VITAMIN D) 1000 UNITS tablet Take 2,000 Units by mouth daily.       . cyclobenzaprine (FLEXERIL) 5 MG tablet Take 1 tablet (5 mg total) by mouth 3 (three) times daily as needed for muscle spasms.  20 tablet  0  . gabapentin (NEURONTIN) 100 MG capsule Take 1 pill nightly for days 1-5 Take 2 pills/night days 6-10  Take 3 pills/night for days 11-15  May take step back if side effects (sleepiness) is too great  90 capsule  3  . meloxicam (MOBIC) 15 MG tablet Take 1 tablet (15 mg total) by mouth daily as needed for pain.  30 tablet  3  . montelukast (SINGULAIR) 10 MG tablet Take 1 tablet (10 mg total) by mouth at bedtime.  30 tablet  11  . simvastatin (ZOCOR) 40 MG tablet Take 1 tablet (40 mg total) by mouth at bedtime. Take 1/2 tab for 1 week then full tab if tolerated.  30 tablet  5   No current facility-administered medications on file prior to visit.    Allergies:    Allergies  Allergen Reactions  . Darvocet [Propoxyphene N-Acetaminophen] Nausea And Vomiting  . Percocet [Oxycodone-Acetaminophen] Nausea And Vomiting  . Shrimp [Shellfish Allergy] Hives and Itching  . Latex Itching and Rash    Family History: Family History  Problem Relation Age of Onset  . Hypertension Mother   . Hypertension Father   . Diabetes Father   . Cancer Paternal Grandmother     unsure  . Cancer Paternal Grandfather     prostate  . Diabetes type II Daughter   . Healthy Son     Social History: History   Social History  . Marital Status: Married    Spouse Name: N/A    Number of Children: N/A  . Years of Education: N/A   Occupational History  . Not on file.   Social History Main Topics  . Smoking status: Never Smoker   . Smokeless tobacco: Not on file  . Alcohol Use: No  . Drug Use: No  . Sexual Activity: Yes   Other Topics Concern  . Not on file   Social History Narrative   Married 21 years in 2015. Lives with 5 people. 2 kids. No grandkids. Husband is a Theme park manager. 3 pregnancies and 2 live births   Owns daycare for 8 years, drive a bus, and Control and instrumentation engineer for [employer], basketball, walking, reading Bible, people person   Kennon Holter MD [facility] past MD-out of network now.                 Review of Systems:  CONSTITUTIONAL: No fevers, chills, night sweats, or weight loss.   EYES: No visual changes or eye pain ENT: No hearing changes.  No history of nose bleeds.   RESPIRATORY: No cough, wheezing and shortness of breath.   CARDIOVASCULAR: Negative for chest pain, and palpitations.   GI: Negative for abdominal discomfort, blood in stools or black stools.  No recent change in bowel habits.   GU:  No history of incontinence.   MUSCLOSKELETAL: +history of joint pain or swelling.  No myalgias.   SKIN: Negative for lesions, rash, and itching.   HEMATOLOGY/ONCOLOGY: Negative for prolonged bleeding, bruising easily, and  swollen nodes.  ENDOCRINE: Negative for cold or heat intolerance, polydipsia or goiter.   PSYCH:  No depression or anxiety symptoms.   NEURO: As Above.   Vital Signs:  BP 120/88  Pulse 86  Ht _0  (1.6 m)  Wt 207 lb 7 oz (94.093 kg)  BMI 36.76 kg/m2  SpO2 96%   General Medical Exam:   General:  Well appearing, comfortable.   Eyes/ENT: see cranial nerve examination.   Neck: No masses appreciated.  Full range of motion without tenderness.  No carotid bruits. Respiratory:  Clear to auscultation, good air entry bilaterally.  Cardiac:  Regular rate and rhythm, no murmur.   Extremities:  No deformities, edema, or skin discoloration. Good capillary refill.   Skin:  Skin color, texture, turgor normal. No rashes or lesions.  Neurological Exam: MENTAL STATUS including orientation to time, place, person, recent and remote memory, attention span and concentration, language, and fund of knowledge is normal.  Speech is not dysarthric.  CRANIAL NERVES: II:  No visual field defects.  Unremarkable fundi.   III-IV-VI: Pupils equal round and reactive to light.  Normal conjugate, extra-ocular eye movements in all directions of gaze.  No nystagmus.  No ptosis.   V:  Normal facial sensation.    VII:  Normal facial symmetry and movements.   VIII:  Normal hearing and vestibular function.   IX-X:  Normal palatal movement.   XI:  Normal shoulder shrug and head rotation.   XII:  Normal tongue strength and range of motion, no deviation or fasciculation.  MOTOR:  No atrophy, fasciculations or abnormal movements.  No pronator drift.  Tone is normal.    Right Upper Extremity:    Left Upper Extremity:    Deltoid  5/5   Deltoid  5/5   Biceps  5/5   Biceps  5/5   Triceps  5/5   Triceps  5/5   Wrist extensors  5/5   Wrist extensors  5/5   Wrist flexors  5/5   Wrist flexors  5/5   Finger extensors  5/5   Finger extensors  5/5   Finger flexors  5/5   Finger flexors  5/5   Dorsal interossei  5/5   Dorsal  interossei  5/5   Abductor pollicis  5/5   Abductor pollicis  5/5   Tone (Ashworth scale)  0  Tone (Ashworth scale)  0   Right Lower Extremity:    Left Lower Extremity:    Hip flexors  5/5   Hip flexors  5/5   Hip extensors  5/5   Hip extensors  5/5   Knee flexors  5/5   Knee flexors  5/5   Knee extensors  5/5   Knee extensors  5/5   Dorsiflexors  5/5   Dorsiflexors  5/5   Plantarflexors  5/5   Plantarflexors  5/5   Toe extensors  5/5   Toe extensors  5/5   Toe flexors  5/5   Toe flexors  5/5   Tone (Ashworth scale)  0  Tone (Ashworth scale)  0   MSRs:  Right                                                                 Left brachioradialis 2+  brachioradialis 2+  biceps 2+  biceps 2+  triceps 2+  triceps 2+  patellar 1+  patellar 2+  ankle jerk 2+  ankle jerk 2+  Hoffman no  Hoffman no  plantar response down  plantar response down   SENSORY:  Normal and symmetric perception of light touch, pinprick, vibration, and proprioception.  Romberg's sign absent.   COORDINATION/GAIT: Normal finger-to- nose-finger.  Antalgic gait.   IMPRESSION: 1.  Bilateral feet paresthesias, worsening since 2014  - MRI lumbar spine to evaluate for worsening of S1 radiculopathy, although reduced patella jerk on right also makes L4  radiculopathy possible  - EMG to help differentiate whether there is an overlapping peripheral neuropathy, especially given her prominent distal painful paresthesias  - Optimize neurontin for pain control  - Neuropathy labs including HbA1c, TSH, vitamin B12, HIV, RPR, SPEP with IFE, ANA, RF, ESR is normal 2.  Chronic low back pain  - Responsive to epidural injections   PLAN/RECOMMENDATIONS:  1. MRI lumbar spine without contrast 2. EMG of the legs 3. Start physical therapy 4. Increase gabapentin to 39m at bedtime (3 tab), if tolerating, ok to take 1026min the morning and 30045mt bedtime.  Instructed her to make medication adjustments on the weekend when she is not  driving.  5. Start water exercises 6. Return clinic 3-m60-monthr sooner as needed    The duration of this appointment visit was 45 minutes of face-to-face time with the patient.  Greater than 50% of this time was spent in counseling, explanation of diagnosis, planning of further management, and coordination of care.   Thank you for allowing me to participate in patient's care.  If I can answer any additional questions, I would be pleased to do so.    Sincerely,    Donika K. PatePosey Pronto

## 2014-05-28 NOTE — Patient Instructions (Signed)
1. MRI lumbar spine without contrast 2. EMG of the legs 3. Start physical therapy 4. Increase gabapentin to 300mg  at bedtime (3 tab), if tolerating, ok to take 100mg  in the morning and 300mg  at bedtime  5. Start water exercises 6. Return clinic 263-months, or sooner as needed

## 2014-05-28 NOTE — Addendum Note (Signed)
Addended by: Vivien RotaRIVER, [place] Munguia H on: 16/10/960410/22/2015 11:02 AM   Modules accepted: Orders

## 2014-05-31 ENCOUNTER — Other Ambulatory Visit

## 2014-05-31 ENCOUNTER — Ambulatory Visit
Admission: RE | Admit: 2014-05-31 | Discharge: 2014-05-31 | Disposition: A | Discharge status: Home / Self Care | Source: Ambulatory Visit | Attending: Neurology | Admitting: Neurology

## 2014-05-31 DIAGNOSIS — M545 Low back pain, unspecified: Secondary | ICD-10-CM

## 2014-05-31 DIAGNOSIS — M79606 Pain in leg, unspecified: Secondary | ICD-10-CM

## 2014-05-31 DIAGNOSIS — R202 Paresthesia of skin: Secondary | ICD-10-CM

## 2014-06-03 ENCOUNTER — Telehealth: Payer: Self-pay | Admitting: Neurology

## 2014-06-03 NOTE — Telephone Encounter (Signed)
Please advise 

## 2014-06-03 NOTE — Telephone Encounter (Signed)
Pt called wanting to f/u on the results for his MRI he had done on 05/31/14. C/B [phone]

## 2014-06-04 NOTE — Telephone Encounter (Signed)
Patient given results

## 2014-06-04 NOTE — Telephone Encounter (Signed)
Called patient back.  Left message for him to call me.

## 2014-06-04 NOTE — Telephone Encounter (Signed)
It just showed some mild degenerative/arthritic changes at several levels but nothing that looks like it would need surgical intervention.  Dr. Allena KatzPatel will review in detail at his f/u appt

## 2014-06-16 ENCOUNTER — Telehealth: Payer: Self-pay | Admitting: Family Medicine

## 2014-06-16 DIAGNOSIS — M545 Low back pain, unspecified: Secondary | ICD-10-CM

## 2014-06-16 NOTE — Telephone Encounter (Signed)
Dr. Hunter please advise. 

## 2014-06-16 NOTE — Telephone Encounter (Signed)
Pt ordered entered. Pt notified that someone from PT will call to schedule

## 2014-06-16 NOTE — Telephone Encounter (Signed)
Pt states she is still having back pain and the meds don't seem to work.

## 2014-06-16 NOTE — Telephone Encounter (Signed)
Per Dr. Lucie LeatherBurchette's note from 10/2-consider PT.   Offer patient PT referral for low back pain. If she wants it, may order. Needs to schedule follow up otherwise and follow up regardless at end of 6 weeks PT.

## 2014-07-01 ENCOUNTER — Ambulatory Visit (INDEPENDENT_AMBULATORY_CARE_PROVIDER_SITE_OTHER): Admitting: Neurology

## 2014-07-01 DIAGNOSIS — M545 Low back pain, unspecified: Secondary | ICD-10-CM

## 2014-07-01 DIAGNOSIS — M79606 Pain in leg, unspecified: Secondary | ICD-10-CM

## 2014-07-01 DIAGNOSIS — M5417 Radiculopathy, lumbosacral region: Secondary | ICD-10-CM

## 2014-07-01 DIAGNOSIS — R202 Paresthesia of skin: Secondary | ICD-10-CM

## 2014-07-01 NOTE — Procedures (Signed)
[facility]  [address], [address]  [place], [place] [address] Tel: [phone] Fax:  [phone] Test Date:  07/01/2014  Patient: [name] DOB: 1966/02/13 Physician: Nita Sickleonika Blen Ransome, DO  Sex: Female Height: 5\' 3"  Ref Phys: Tana ConchHunter, Stephen  ID#: [id number]   Technician: Ala BentSusan Reid R. NCS T.   Patient Complaints: Patient is a 48 year old female here for evaluation of bilateral feet, legs and back left worse than right with symptoms of pain.  NCV & EMG Findings: Extensive electrodiagnostic testing of the left lower extremity and additional studies of the right shows: 1. Bilateral sural and superficial peroneal sensory responses are within normal limits.  2. Bilateral peroneal and tibial motor responses are within normal limits.  3. H-reflex studies indicate that the left tibial H-reflex has no response.  The right tibial H-reflex has prolonged latency (35.37 ms).   4. Chronic motor axon loss changes are seen affecting L5-S1 myotomes on the right and L2-S1 myotomes on the left, without accompanied active denervation.    Impression: 1. Chronic L5-S1 radiculopathy affecting the right lower extremity, mild to moderate in degree electrically.  2. Chronic L2-S1 radiculopathy affecting the left lower extremity, moderate in degree electrically.  3. There is no evidence of a generalized sensorimotor polyneuropathy affecting the lower extremities.    ___________________________ Nita Sickleonika Rustin Erhart, DO    Nerve Conduction Studies Anti Sensory Summary Table   Site NR Peak (ms) Norm Peak (ms) P-T Amp (V) Norm P-T Amp  Left Sup Peroneal Anti Sensory (Ant Lat Mall)  36C  12 cm    2.9 <4.5 12.9 >5  Right Sup Peroneal Anti Sensory (Ant Lat Mall)  36C  12 cm    2.7 <4.5 11.8 >5  Left Sural Anti Sensory (Lat Mall)  36C  Calf    4.0 <4.5 13.0 >5  Right Sural Anti Sensory (Lat Mall)  36C  Calf    3.3 <4.5 14.0 >5   Motor Summary Table   Site NR Onset (ms) Norm Onset (ms)  O-P Amp (mV) Norm O-P Amp Site1 Site2 Delta-0 (ms) Dist (cm) Vel (m/s) Norm Vel (m/s)  Left Peroneal Motor (Ext Dig Brev)  36C  Ankle    2.6 <5.5 6.0 >3 B Fib Ankle 6.7 31.0 46 >40  B Fib    9.3  5.1  Poplt B Fib 1.8 9.0 50 >40  Poplt    11.1  5.1         Right Peroneal Motor (Ext Dig Brev)  36C  Ankle    3.4 <5.5 7.4 >3 B Fib Ankle 6.3 32.5 52 >40  B Fib    9.7  6.7  Poplt B Fib 1.6 8.5 53 >40  Poplt    11.3  6.5         Left Tibial Motor (Abd Hall Brev)  36C  Ankle    3.3 <6.0 13.7 >8 Knee Ankle 8.5 41.0 48 >40  Knee    11.8  8.1         Right Tibial Motor (Abd Hall Brev)  36C  Ankle    3.0 <6.0 9.5 >8 Knee Ankle 8.1 38.0 47 >40  Knee    11.1  6.1          F Wave Studies   NR F-Lat (ms) Lat Norm (ms) L-R F-Lat (ms)  Left Tibial (Mrkrs) (Abd Hallucis)  36C     48.59 <55 0.00  Right Tibial (Mrkrs) (Abd Hallucis)  36C  48.59 <55 0.00   H Reflex Studies   NR H-Lat (ms) Lat Norm (ms) L-R H-Lat (ms)  Left Tibial (Gastroc)  36C  NR  <35   Right Tibial (Gastroc)  36C     35.37 <35    EMG   Side Muscle Ins Act Fibs Psw Fasc Number Recrt Dur Dur. Amp Amp. Poly Poly. Comment  Left Gastroc Nml Nml Nml Nml 1- Mod-R Some 1+ Few 1+ Nml Nml N/A  Left AdductorLong Nml Nml Nml Nml 1- Mod-R Some 1+ Some 1+ Nml Nml N/A  Left Flex Dig Long Nml Nml Nml Nml 1- Mod Nml Nml Nml Nml Nml Nml N/A  Left RectFemoris Nml Nml Nml Nml 1- Mod-R Some 1+ Some 1+ Nml Nml N/A  Left GluteusMed Nml Nml Nml Nml 1- Rapid Some 1+ Some 1+ Nml Nml N/A  Left BicepsFemS Nml Nml Nml Nml 1- Mod-R Few 1+ Few 1+ Nml Nml N/A  Left AntTibialis Nml Nml Nml Nml 1- Mod-R Some 1+ Few 1+ Some 1+ N/A  Right Flex Dig Long Nml Nml Nml Nml 1- Mod-R Some 1+ Nml Nml Nml Nml N/A  Right AntTibialis Nml Nml Nml Nml 1- Mod-R Some 1+ Some 1+ Nml Nml N/A  Right Gastroc Nml Nml Nml Nml 1- Mod-R Few 1+ Nml Nml Nml Nml N/A  Right RectFemoris Nml Nml Nml Nml Nml Nml Nml Nml Nml Nml Nml Nml N/A  Right GluteusMed Nml Nml Nml Nml 1-  Mod-R Some 1+ Nml Nml Nml Nml N/A  Right BicepsFemS Nml Nml Nml Nml 1- Mod-R Few 1+ Few 1+ Nml Nml N/A      Waveforms:

## 2014-07-07 ENCOUNTER — Ambulatory Visit: Attending: Family Medicine | Admitting: Physical Therapy

## 2014-07-07 ENCOUNTER — Other Ambulatory Visit: Payer: Self-pay | Admitting: *Deleted

## 2014-07-07 DIAGNOSIS — R5381 Other malaise: Secondary | ICD-10-CM | POA: Insufficient documentation

## 2014-07-07 DIAGNOSIS — M545 Low back pain: Secondary | ICD-10-CM | POA: Diagnosis present

## 2014-07-07 MED ORDER — NORTRIPTYLINE HCL 10 MG PO CAPS: 20.0000 mg | ORAL_CAPSULE | Freq: Every day | ORAL | Status: DC

## 2014-07-16 ENCOUNTER — Ambulatory Visit

## 2014-07-17 ENCOUNTER — Telehealth: Payer: Self-pay | Admitting: Family Medicine

## 2014-07-17 NOTE — Telephone Encounter (Signed)
PLEASE NOTE: All timestamps contained within this report are represented as [place] Time. CONFIDENTIALTY NOTICE: This fax transmission is intended only for the addressee. It contains information that is legally privileged, confidential or otherwise protected from use or disclosure. If you are not the intended recipient, you are strictly prohibited from reviewing, disclosing, copying using or disseminating any of this information or taking any action in reliance on or regarding this information. If you have received this fax in error, please notify us immediately by telephone so that we can arrange for its return to us. Phone: [phone], Toll-Free: [phone], Fax: [phone] Page: 1 of 2 Call Id: [id number] [facility] - Client TELEPHONE ADVICE RECORD [facility] Patient Name: [name] Gender: Female DOB: May 27, 1966 Age: [age] Y 7 M 23 D Return Phone Number: [phone] (Primary) Address: [place]State[place]Zip: [place] [address] Client [facility] - Client Client Site  [facility] Physician Tana ConchHunter, Stephen Contact Type Call Call Type Triage / Clinical Relationship To Patient Self Return Phone Number [phone] (Primary) Chief Complaint Dizziness Initial Comment Caller states her eyes are twitching, head swimming PreDisposition Did not know what to do Nurse Assessment Nurse: Kemper Durielarke, RN, Haley Joinerebecca Date/Time (Eastern Time): 07/17/2014 9:26:39 AM Confirm and document reason for call. If symptomatic, describe symptoms. ---Caller states she feels dizzy and eye twitch and feels pressure over eyes and head. No fever. Has the patient traveled out of the country within the last 30 days? ---Not Applicable Does the patient require triage? ---Yes Related visit to physician within the last 2 weeks? ---No Does the PT have any chronic conditions? (i.e. diabetes, asthma, etc.) ---No Did the  patient indicate they were pregnant? ---No Guidelines Guideline Title Affirmed Question Affirmed Notes Nurse Date/Time (Eastern Time) Dizziness - Vertigo [1] MODERATE dizziness (e.g., feels very unsteady, interferes with normal activities) AND [2] has NOT been evaluated by physician for this Kemper Durielarke, RN, [name] 07/17/2014 9:27:30 AM Disp. Time Lamount Cohen(Eastern Time) Disposition Final User 07/17/2014 9:15:39 AM Attempt made - message left Cheryl FlashClarke, RN, Rebecca 07/17/2014 9:30:28 AM See Physician within 24 Hours Yes Kemper Durielarke, RN, Cecil Cobbsebecca Caller Understands: Yes PLEASE NOTE: All timestamps contained within this report are represented as [place] Standard Time. CONFIDENTIALTY NOTICE: This fax transmission is intended only for the addressee. It contains information that is legally privileged, confidential or otherwise protected from use or disclosure. If you are not the intended recipient, you are strictly prohibited from reviewing, disclosing, copying using or disseminating any of this information or taking any action in reliance on or regarding this information. If you have received this fax in error, please notify us immediately by telephone so that we can arrange for its return to us. Phone: [phone], Toll-Free: [phone], Fax: [phone] Page: 2 of 2 Call Id: [id number] Disagree/Comply: Comply Care Advice Given Per Guideline SEE PHYSICIAN WITHIN 24 HOURS: * IF OFFICE WILL BE OPEN: You need to be examined within the next 24 hours. Call your doctor when the office opens, and make an appointment. CALL BACK IF: * You become worse. CARE ADVICE given per Dizziness - Vertigo (Adult) guideline. Warm transferred to appt line. After Care Instructions Given Call Event Type User Date / Time Description Referrals REFERRED TO PCP OFFICE  PT OFFERED APPT WITH DR. Clent RidgesFRY FOR THIS AFTERNOON, PT DECLINED STATING SHE WOULD BE AT WORK AT THE TIME.  SHE IS GOING TO SEEK TREATMENT AT URGENT CARE FACILITY.

## 2014-07-22 ENCOUNTER — Ambulatory Visit: Admitting: Rehabilitation

## 2014-07-23 ENCOUNTER — Ambulatory Visit

## 2014-07-23 ENCOUNTER — Telehealth: Payer: Self-pay | Admitting: Physical Therapy

## 2014-07-23 NOTE — Telephone Encounter (Signed)
Called patient about no show x 2.  Left message for patient to return call to determine if pt wants to continue PT or d/c.

## 2014-07-28 ENCOUNTER — Telehealth: Payer: Self-pay

## 2014-07-28 NOTE — Telephone Encounter (Signed)
FYI

## 2014-07-28 NOTE — Telephone Encounter (Signed)
[facility] - Client TELEPHONE ADVICE RECORD [facility] Patient Name: [name] Gender: Female DOB: [birth year] Age: 1946 Y 8 M 3 D Return Phone Number: [phone] (Primary) Address: City/State/Zip: [place] [address] Client [facility] - Client Client Site [facility] Day Physician Tana ConchHunter, Stephen Contact Type Call Call Type Triage / Clinical Relationship To Patient Self Return Phone Number [phone] (Primary) Chief Complaint Hand Pain Initial Comment Caller states that she has a knot on her left hand and is in a lot of pain. PreDisposition Call Doctor Nurse Assessment Nurse: Manson PasseyBrown, RN, Lyn Date/Time Lamount Cohen(Eastern Time): 07/27/2014 5:05:10 PM Confirm and document reason for call. If symptomatic, describe symptoms. ---Caller states that she has a pea sized knot (Was a lot bigger; about the size of a marble)on her left hand at the base of her thumb. Severe pain is shooting into her hand and fingers; especially if she is trying to grip anything. States if she bends her pointer finger she has to pry it open. Hand and fingers are swollen. Began about a month ago and the pain is getting worse. States has had Carpel surgery on both hands. Has the patient traveled out of the country within the last 30 days? ---No Does the patient require triage? ---Yes Related visit to physician within the last 2 weeks? ---No Does the PT have any chronic conditions? (i.e. diabetes, asthma, etc.) ---Yes List chronic conditions. ---Carpel surgery on both hands, Chronic Sinus problems Did the patient indicate they were pregnant? ---No Guidelines Guideline Title Affirmed Question Affirmed Notes Nurse Date/Time (Eastern Time) Hand and Wrist Pain Numbness (i.e., loss of sensation) in hand or fingers (Exception: just tingling; numbness present > 2 weeks) Manson PasseyBrown, RN, Lyn 07/27/2014 5:12:15 PM Disp. Time  Lamount Cohen(Eastern Time) Disposition Final User 07/27/2014 4:29:02 PM Send To Clinical Follow Up Denice BorsQueue Beeler, Carolyn 07/27/2014 5:15:41 PM See Physician within 24 Hours Yes Manson PasseyBrown, RN, Lyn PLEASE NOTE: All timestamps contained within this report are represented as [place] Standard Time. CONFIDENTIALTY NOTICE: This fax transmission is intended only for the addressee. It contains information that is legally privileged, confidential or otherwise protected from use or disclosure. If you are not the intended recipient, you are strictly prohibited from reviewing, disclosing, copying using or disseminating any of this information or taking any action in reliance on or regarding this information. If you have received this fax in error, please notify us immediately by telephone so that we can arrange for its return to us. Phone: [phone], Toll-Free: [phone], Fax: [phone] Page: 2 of 2 Call Id: [phone] Caller Understands: Yes Disagree/Comply: Comply Care Advice Given Per Guideline SEE PHYSICIAN WITHIN 24 HOURS: * IF OFFICE WILL BE OPEN: You need to be examined within the next 24 hours. Call your doctor when the office opens, and make an appointment. CARE ADVICE given per Hand and Wrist Pain (Adult) guideline. CALL BACK IF: * You become worse. After Care Instructions Given Call Event Type User Date / Time Description Comments User: Laruth BouchardLyn, Brown, RN Date/Time Lamount Cohen(Eastern Time): 07/27/2014 5:16:40 PM Warm transferred to the office person, Marchelle Folksmanda, for an appointment. Referrals REFERRED TO PCP OFFICE  Pt has upcoming appt on 12.23.2015.

## 2014-07-29 ENCOUNTER — Ambulatory Visit (INDEPENDENT_AMBULATORY_CARE_PROVIDER_SITE_OTHER): Admitting: Family Medicine

## 2014-07-29 ENCOUNTER — Encounter: Payer: Self-pay | Admitting: Family Medicine

## 2014-07-29 VITALS — BP 110/84 | HR 84 | Wt 212.0 lb

## 2014-07-29 DIAGNOSIS — M25532 Pain in left wrist: Secondary | ICD-10-CM

## 2014-07-29 MED ORDER — PREDNISONE 20 MG PO TABS: 20.0000 mg | ORAL_TABLET | Freq: Every day | ORAL | Status: DC

## 2014-07-29 NOTE — Patient Instructions (Signed)
Use wrist brace during night  Take prednisone for 7 days to calm down inflammation  Can use meloxicam as well but may not be as helpful.   Referred you to [facility] since that is where you had your surgery  If your pain were to worsen before referral, they have an after hours weekday clinic

## 2014-07-29 NOTE — Progress Notes (Signed)
Tana ConchStephen Hunter, MD Phone: [phone]  Subjective:   Haley PeersSolange Robertson is a 48 y.o. year old very pleasant female patient who presents with the following:  Left wrist/hand pain Patient with history of carpal tunnel release several years ago. Over the last month she complains of similar pain, describing left wrist, palm pain. Denies any pain in finger 5. She also has some numbness in first 4 fingers. Worse in AM and seems to get mildly better as day progresses. Denies falls/injury. Pain up to 10/10 in AM but mostly 6-7/10 later in day. Has taken some of husband's ibuprofen 800mg  with some help.  ROS- no fever/chills. No chest pain/shortness of breath. No neck or shoulder pain.   Past Medical History- Patient Active Problem List   Diagnosis Date Noted  . Peripheral neuropathy 04/10/2014    Priority: Medium  . Hypertriglyceridemia 04/03/2014    Priority: Medium  . Obesity 04/03/2014    Priority: Medium  . Hyperlipidemia 03/03/2014    Priority: Medium  . Headache(784.0) 04/03/2014    Priority: Low  . Breast pain, right 04/03/2014    Priority: Low  . Insomnia 04/03/2014    Priority: Low  . Arthritis 03/03/2014    Priority: Low  . Seasonal allergies 03/03/2014    Priority: Low   Medications- reviewed and updated Current Outpatient Prescriptions  Medication Sig Dispense Refill  . calcium carbonate (OS-CAL) 600 MG TABS tablet Take 600 mg by mouth 2 (two) times daily with a meal.    . cholecalciferol (VITAMIN D) 1000 UNITS tablet Take 2,000 Units by mouth daily.     . meloxicam (MOBIC) 15 MG tablet Take 1 tablet (15 mg total) by mouth daily as needed for pain. 30 tablet 3  . montelukast (SINGULAIR) 10 MG tablet Take 1 tablet (10 mg total) by mouth at bedtime. 30 tablet 11  . nortriptyline (PAMELOR) 10 MG capsule Take 2 capsules (20 mg total) by mouth at bedtime. 60 capsule 3  . simvastatin (ZOCOR) 40 MG tablet Take 1 tablet (40 mg total) by mouth at bedtime. Take 1/2 tab for 1 week then  full tab if tolerated. 30 tablet 5  . cyclobenzaprine (FLEXERIL) 5 MG tablet Take 1 tablet (5 mg total) by mouth 3 (three) times daily as needed for muscle spasms. (Patient not taking: Reported on 07/29/2014) 20 tablet 0  . predniSONE (DELTASONE) 20 MG tablet Take 1 tablet (20 mg total) by mouth daily with breakfast. 7 tablet 0   No current facility-administered medications for this visit.    Objective: BP 110/84 mmHg  Pulse 84  Wt 212 lb (96.163 kg) Gen: NAD, resting comfortably CV: RRR no murmurs rubs or gallops Lungs: CTAB no crackles, wheeze, rhonchi No chest wall pain  Phalen maneuver causes pain in left palm as well as numbness in first 4 fingers paring lateral portion of 4h finger. No pain with tinel's. Patient complains of difficulty placing rings on but no clear swelling L>R in hands. No erythema, no rash. Patient tender to palpation throughout first 3.5 fingers but worst in thenar eminence. Normal wrist motion and no pain with movement of wrist.   Assessment/Plan:  Left wrist/hand pain Had carpal tunnel release but symptoms suspicious for carpal tunnel. Place in wrist brace, prednisone x 7 days. Refer back to [facility] who did carpal tunnel release per patient. Return precautions advised and advised may use after hours clinic at [facility].   Orders Placed This Encounter  Procedures  . Ambulatory referral to Orthopedic Surgery  Referral Priority:  Routine    Referral Type:  Surgical    Referral Reason:  Specialty Services Required    Requested Specialty:  Orthopedic Surgery    Number of Visits Requested:  1    Meds ordered this encounter  Medications  . predniSONE (DELTASONE) 20 MG tablet    Sig: Take 1 tablet (20 mg total) by mouth daily with breakfast.    Dispense:  7 tablet    Refill:  0

## 2014-07-30 ENCOUNTER — Ambulatory Visit: Admitting: Neurology

## 2014-07-30 ENCOUNTER — Telehealth: Payer: Self-pay | Admitting: Neurology

## 2014-07-30 NOTE — Telephone Encounter (Signed)
Pt resch appt from 07-30-14 to 09-24-14  Pt is sick

## 2014-08-31 ENCOUNTER — Ambulatory Visit: Admitting: Neurology

## 2014-09-10 ENCOUNTER — Telehealth: Payer: Self-pay | Admitting: Family Medicine

## 2014-09-10 NOTE — Telephone Encounter (Signed)
FYI:  Pt has a form that needs dr Therapist, nutritionalhunter to fill out saying that she is able to do private duty, sitting w/ pt . Pt will fax form.

## 2014-09-16 NOTE — Telephone Encounter (Signed)
Create slot 4pm tomorrow and I will see her

## 2014-09-16 NOTE — Telephone Encounter (Signed)
Pt is needing her form filled out for a job and it requries physical examination. Where can the schedulers work pt in so she can get this taken care of so she can start working?

## 2014-09-16 NOTE — Telephone Encounter (Signed)
Left Vm for pt tcb. When pt calls back she will need to be scheduled for a physical in order to have form completed.

## 2014-09-17 NOTE — Telephone Encounter (Signed)
That's fine.  Thanks for asking.

## 2014-09-17 NOTE — Telephone Encounter (Signed)
See below

## 2014-09-17 NOTE — Telephone Encounter (Signed)
Pt states she cannot come in today.  Pt works 2 jobs and has a break between 10 am and 1pm every day.  Would like to come in any day next week. A 10:45 would be good if we can take one of those times pls advise if ok to sch

## 2014-09-23 ENCOUNTER — Ambulatory Visit: Admitting: Family Medicine

## 2014-09-24 ENCOUNTER — Encounter: Payer: Self-pay | Admitting: Neurology

## 2014-09-24 ENCOUNTER — Ambulatory Visit (INDEPENDENT_AMBULATORY_CARE_PROVIDER_SITE_OTHER): Admitting: Neurology

## 2014-09-24 VITALS — BP 110/80 | HR 72 | Ht 63.0 in | Wt 204.4 lb

## 2014-09-24 DIAGNOSIS — M791 Myalgia, unspecified site: Secondary | ICD-10-CM

## 2014-09-24 DIAGNOSIS — M2550 Pain in unspecified joint: Secondary | ICD-10-CM

## 2014-09-24 MED ORDER — DULOXETINE HCL 20 MG PO CPEP: 20.0000 mg | ORAL_CAPSULE | Freq: Every day | ORAL | Status: DC

## 2014-09-24 NOTE — Progress Notes (Signed)
Follow-up Visit   Date: 09/24/2014   [name] MRN: [id number] DOB: 01-17-1966   Interim History: [name] is a 49 y.o. right-handed African American female with hyperlipidemia, bilateral CTS release, and seaonsal allergies returning to the clinic for follow-up of bilateral leg pain.  The patient was accompanied to the clinic by self.  History of present illness: Starting 2013, she developed intermittent achy, stabbing pain of her feet and lower legs. Symptoms worsened last year because they are now constant. It predominately affects the soles of her feet and is worse with prolonged standing and bending. It is alleviated by rest. She has a harder time getting up out of a chair. 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 She was evaluated by her PCP who ordered laboratory studies for neuropathy which returned normal (see below).   He also reports long history of low back pain. She injured her back while working at a [employer] in 1996. She has occasional radiating pain into her legs. In the past, she had epidural injection which helps her pain. Her last MRI lumbar spine was in 2007 which showed small disc protrusion at L5-S1 with mild displacement of the right S1 nerve.  She denies any weakness or falls. She has generalized fatigue.   UPDATE 09/24/2014: Patient underwent EMG and MRI lumbar spine which showed mild multilevel lumbosacral radiculopathy.  there was no evidence of neuropathy.  She stopped gabapentin because of hand swelling and nortriptyline gave her fever blisters, but neither really provided any benefit.  She does not have any burning, shooting, or tingling pain.  Moreso, she dsecribes her legs as if there are sore and bruised, but pain is so severe she "wants to cut my feet off".  Pain involved her entire thigh and lower legs into her feet.  She had pain with  ankle movement.  No pain to muscle palpation or weakness.  The only thing that has helped her pain is if she takes ibupofen and was she was given a course of prednisone for her hand pain.  She endorses morning stiffness and has great difficulty with walking.  No weakness or falls.   Medications:  Current Outpatient Prescriptions on File Prior to Visit  Medication Sig Dispense Refill  . calcium carbonate (OS-CAL) 600 MG TABS tablet Take 600 mg by mouth 2 (two) times daily with a meal.    . cholecalciferol (VITAMIN D) 1000 UNITS tablet Take 2,000 Units by mouth daily.     . cyclobenzaprine (FLEXERIL) 5 MG tablet Take 1 tablet (5 mg total) by mouth 3 (three) times daily as needed for muscle spasms. 20 tablet 0  . meloxicam (MOBIC) 15 MG tablet Take 1 tablet (15 mg total) by mouth daily as needed for pain. 30 tablet 3  . montelukast (SINGULAIR) 10 MG tablet Take 1 tablet (10 mg total) by mouth at bedtime. 30 tablet 11  . simvastatin (ZOCOR) 40 MG tablet Take 1 tablet (40 mg total) by mouth at bedtime. Take 1/2 tab for 1 week then full tab if tolerated. 30 tablet 5   No current facility-administered medications on file prior to visit.    Allergies:  Allergies  Allergen Reactions  . Darvocet [Propoxyphene N-Acetaminophen] Nausea And Vomiting  . Percocet [Oxycodone-Acetaminophen] Nausea And Vomiting  . Shrimp [Shellfish Allergy] Hives and Itching  . Latex Itching and Rash    Review of Systems:  CONSTITUTIONAL: No fevers, chills, night sweats, or weight loss.  EYES: No visual changes or eye pain ENT: No hearing changes.  No history of nose bleeds.   RESPIRATORY: No cough, wheezing and shortness of breath.   CARDIOVASCULAR: Negative for chest pain, and palpitations.   GI: Negative for abdominal discomfort, blood in stools or black stools.  No recent change in bowel habits.   GU:  No history of incontinence.   MUSCLOSKELETAL: +history of joint pain or swelling.  +myalgias.   SKIN: Negative for  lesions, rash, and itching.   ENDOCRINE: Negative for cold or heat intolerance, polydipsia or goiter.   PSYCH:  No depression or anxiety symptoms.   NEURO: As Above.   Vital Signs:  BP 110/80 mmHg  Pulse 72  Ht '5\' 3"'  (1.6 m)  Wt 204 lb 7 oz (92.732 kg)  BMI 36.22 kg/m2  SpO2 98%  Neurological Exam: MENTAL STATUS including orientation to time, place, person, recent and remote memory, attention span and concentration, language, and fund of knowledge is normal.  Speech is not dysarthric.  CRANIAL NERVES:  Pupils equal round and reactive to light.  Face is symmetric. Palate elevates symmetrically.  Tongue is midline.  MOTOR:  Motor strength is 5/5 in all extremities.  No tenderness to palpation of muscles or over fibromyalgia tenderpoints  Tone is normal.    MSRs:  Reflexes are 2+/4 throughout.  SENSORY:  Intact to to vibration throughout.  COORDINATION/GAIT:  Normal finger-to- nose-finger and heel-to-shin.  Intact rapid alternating movements bilaterally.  Gait narrow based and stable.   Data: Labs 04/10/2014:  HIV neg, RPR neg, TSH 0.35, vitamin B12 321, ANA neg, SPEP/UPEP with IFE no M protein, RF negative, ESR 15, HbA1c 5.6  MRI lumbar spine 05/31/2014: L1-2: Unremarkable. L2-3: Mild abnormal displacement of the left L2 nerve in the lateral extraforaminal space due to lateral extraforaminal disc protrusion. Borderline left foraminal stenosis due to underlying disc bulge. L3-4: No impingement. Mild disc bulge. L4-5: Mild bilateral foraminal stenosis and mild left subarticular lateral recess stenosis due to disc bulge and facet arthropathy. L5-S1: Mild bilateral foraminal stenosis due to intervertebral spurring, facet arthropathy, and disc bulge. A synovial cyst measuring 9 mm lateral to the right neural foramen does not currently impinge on the right L5 nerve could.  IMPRESSION: 1. Lumbar spondylosis and degenerative disc disease, causing mild impingement at L2-3, L4-5, and L5-S  1, as detailed above.   EMG 07/01/2014: 1. Chronic L5-S1 radiculopathy affecting the right lower extremity, mild to moderate in degree electrically.  2. Chronic L2-S1 radiculopathy affecting the left lower extremity, moderate in degree electrically.  3. There is no evidence of a generalized sensorimotor polyneuropathy affecting the lower extremities.   IMPRESSION/PLAN: Ms. Gruber is a 49 year-old female presenting for follow-up of bilateral leg pain.  Her symptoms have seemed to transition from burning paresthesias and now she complaints of lower extremity myalgias and arthralgias. EMG and imaging of her lumbar spine shows mild multilevel lumbosacral impingement, but this is too mild to manifest with the severity of symptoms that she has.  There is no evidence of neuropathy and her symptoms are not characteristic for nerve-related problem, especially with the absence of paresthesias and radicular pain. In fact, she reports her low back pain is doing much better. She now describes her leg pain as severe soreness with associated arthralgias.  Pain is worse in the morning, causing difficulty with walking.  Unfortunately, her pain is out of proportion to her exam and  I cannot find a neurological basis for it.  She had underwent imaging, EMG, and laboratory testing for HbA1c, TSH, vitamin B12, HIV, RPR, SPEP with IFE, ANA, RF, ESR which has been non-diagnostic.  She does note that symptoms improved briefly while she was steroids and with NSAIDs.  I will refer her to Rheumatology for her arthralgias and myalgias.  In the meantime, will give her a trial of Cymbalta 76m for her pain.  She has previously tried gabapentin and nortriptyline without benefit.   The duration of this appointment visit was 25 minutes of face-to-face time with the patient.  Greater than 50% of this time was spent in counseling, explanation of diagnosis, planning of further management, and coordination of care.   Thank you for  allowing me to participate in patient's care.  If I can answer any additional questions, I would be pleased to do so.    Sincerely,    Gerrad Welker K. PPosey Pronto DO

## 2014-09-24 NOTE — Patient Instructions (Addendum)
1.  Referral to rheumatology 2.  Start Cymbalta 20mg  daily

## 2014-09-29 ENCOUNTER — Ambulatory Visit: Admitting: Family Medicine

## 2014-10-01 ENCOUNTER — Encounter: Payer: Self-pay | Admitting: Family Medicine

## 2014-10-01 ENCOUNTER — Ambulatory Visit (INDEPENDENT_AMBULATORY_CARE_PROVIDER_SITE_OTHER): Admitting: Family Medicine

## 2014-10-01 VITALS — BP 130/82 | Temp 98.3°F | Wt 205.0 lb

## 2014-10-01 DIAGNOSIS — E785 Hyperlipidemia, unspecified: Secondary | ICD-10-CM

## 2014-10-01 DIAGNOSIS — M791 Myalgia, unspecified site: Secondary | ICD-10-CM

## 2014-10-01 DIAGNOSIS — Z23 Encounter for immunization: Secondary | ICD-10-CM

## 2014-10-01 MED ORDER — PREDNISONE 20 MG PO TABS: ORAL_TABLET | ORAL | Status: DC

## 2014-10-01 NOTE — Patient Instructions (Addendum)
Filled out form  Sorry for continued pain  Let's try off the simvastatin for 2 weeks to see if that helps pain at all. Journal your pain level during that time each day.   If no relief restart statin and try 7 day course of prednisone. If you have significant relief, hold off on prednisone.   Take journal of symptoms to rheumatology with these changes

## 2014-10-01 NOTE — Assessment & Plan Note (Signed)
S/HPI: Started patient on statin for triglycerides >400. Has had worsening leg pain. Possible statin myalgia? Ros-no chest pain or shortness of breath A/P: we will trial patient off of statin to see if benefit to pain level. If not -she will restart medication

## 2014-10-01 NOTE — Progress Notes (Signed)
Haley Reddish, MD Phone: [phone]  Subjective:   [name] is a 49 y.o. year old very pleasant female patient who presents with the following:  See problem oriented charting under myalgia and hyperlipidemia  Past Medical History- Patient Active Problem List   Diagnosis Date Noted  . Peripheral neuropathy 04/10/2014    Priority: Medium  . Hypertriglyceridemia 04/03/2014    Priority: Medium  . Hyperlipidemia 03/03/2014    Priority: Medium  . Headache(784.0) 04/03/2014    Priority: Low  . Insomnia 04/03/2014    Priority: Low  . Obesity 04/03/2014    Priority: Low  . Arthritis 03/03/2014    Priority: Low  . Seasonal allergies 03/03/2014    Priority: Low   Medications- reviewed and updated Current Outpatient Prescriptions  Medication Sig Dispense Refill  . calcium carbonate (OS-CAL) 600 MG TABS tablet Take 600 mg by mouth 2 (two) times daily with a meal.    . cholecalciferol (VITAMIN D) 1000 UNITS tablet Take 2,000 Units by mouth daily.     . montelukast (SINGULAIR) 10 MG tablet Take 1 tablet (10 mg total) by mouth at bedtime. 30 tablet 11  . simvastatin (ZOCOR) 40 MG tablet Take 1 tablet (40 mg total) by mouth at bedtime. Take 1/2 tab for 1 week then full tab if tolerated. 30 tablet 5  . cyclobenzaprine (FLEXERIL) 5 MG tablet Take 1 tablet (5 mg total) by mouth 3 (three) times daily as needed for muscle spasms. (Patient not taking: Reported on 10/01/2014) 20 tablet 0  . meloxicam (MOBIC) 15 MG tablet Take 1 tablet (15 mg total) by mouth daily as needed for pain. (Patient not taking: Reported on 10/01/2014) 30 tablet 3   Objective: BP 130/82 mmHg  Temp(Src) 98.3 F (36.8 C)  Wt 205 lb (92.987 kg) Gen: NAD, resting comfortably in chair, rises slowly to move to table CV: RRR no murmurs rubs or gallops Lungs: CTAB no crackles, wheeze, rhonchi Abdomen: soft/nontender/nondistended/normal bowel sounds.  Ext: no edema Skin: warm, dry, no rash Neuro: grossly normal, moves  all extremities, 5/5 strength lower extremities   Assessment/Plan:  Myalgia S/HPI: complains of continued leg pain now reported as diffuse 10/10 aching instead of prior burning sensation. ROS- no fecal  Or urinary incontinence or leg weakness.   A/P I have changed the title of this problem from peripheral neuropathy to myalgia/arthralgia after review of neurology note  09/24/14 Dr. Posey Pronto "[name] is a 49 year-old female presenting for follow-up of bilateral leg pain. Her symptoms have seemed to transition from burning paresthesias and now she complaints of lower extremity myalgias and arthralgias. EMG and imaging of her lumbar spine shows mild multilevel lumbosacral impingement, but this is too mild to manifest with the severity of symptoms that she has. There is no evidence of neuropathy and her symptoms are not characteristic for nerve-related problem, especially with the absence of paresthesias and radicular pain. In fact, she reports her low back pain is doing much better. She now describes her leg pain as severe soreness with associated arthralgias. Pain is worse in the morning, causing difficulty with walking. Unfortunately, her pain is out of proportion to her exam and I cannot find a neurological basis for it. She had underwent imaging, EMG, and laboratory testing for HbA1c, TSH, vitamin B12, HIV, RPR, SPEP with IFE, ANA, RF, ESR which has been non-diagnostic. She does note that symptoms improved briefly while she was steroids and with NSAIDs. I will refer her to Rheumatology for her arthralgias and myalgias.  In the meantime, will give her a trial of Cymbalta 31m for her pain. She has previously tried gabapentin and nortriptyline without benefit."  I also find it odd the transition of the type of pain from an initial burning sensation now to a diffuse aching throughout both legs. I am reassured by neurological workup but patient continues in pain. Has appointment with rheumatology  within a month. One transition that was made when pain transitioned was restarting statin so she will trial off for 2 weeks and journal pain severity. If no improvement, trial 7 day course of prednisone. Track daily with # score of pain and then follow up with rheum.    Hyperlipidemia S/HPI: Started patient on statin for triglycerides >400. Has had worsening leg pain. Possible statin myalgia? Ros-no chest pain or shortness of breath A/P: we will trial patient off of statin to see if benefit to pain level. If not -she will restart medication  Follow up after patient sees rheumatology  Orders Placed This Encounter  Procedures  . Flu Vaccine QUAD 36+ mos IM   Meds ordered this encounter  Medications  . predniSONE (DELTASONE) 20 MG tablet    Sig: Take 2 tabs for 3 days, then 1 tab for 4 days    Dispense:  10 tablet    Refill:  0

## 2014-10-01 NOTE — Assessment & Plan Note (Signed)
S: complains of continued leg pain now reported as diffuse 10/10 aching instead of prior burning sensation. ROS- no fecal  Or urinary incontinence or leg weakness.   A/P I have changed the title of this problem from peripheral neuropathy to myalgia/arthralgia after review of neurology note  09/24/14 Dr. Posey Pronto "[name] is a 49 year-old female presenting for follow-up of bilateral leg pain. Her symptoms have seemed to transition from burning paresthesias and now she complaints of lower extremity myalgias and arthralgias. EMG and imaging of her lumbar spine shows mild multilevel lumbosacral impingement, but this is too mild to manifest with the severity of symptoms that she has. There is no evidence of neuropathy and her symptoms are not characteristic for nerve-related problem, especially with the absence of paresthesias and radicular pain. In fact, she reports her low back pain is doing much better. She now describes her leg pain as severe soreness with associated arthralgias. Pain is worse in the morning, causing difficulty with walking. Unfortunately, her pain is out of proportion to her exam and I cannot find a neurological basis for it. She had underwent imaging, EMG, and laboratory testing for HbA1c, TSH, vitamin B12, HIV, RPR, SPEP with IFE, ANA, RF, ESR which has been non-diagnostic. She does note that symptoms improved briefly while she was steroids and with NSAIDs. I will refer her to Rheumatology for her arthralgias and myalgias. In the meantime, will give her a trial of Cymbalta 58m for her pain. She has previously tried gabapentin and nortriptyline without benefit."  I also find it odd the transition of the type of pain from an initial burning sensation now to a diffuse aching throughout both legs. I am reassured by neurological workup but patient continues in pain. Has appointment with rheumatology within a month. One transition that was made when pain transitioned was restarting statin  so she will trial off for 2 weeks and journal pain severity. If no improvement, trial 7 day course of prednisone. Track daily with # score of pain and then follow up with rheum.

## 2014-11-03 ENCOUNTER — Ambulatory Visit: Admitting: Family Medicine

## 2014-12-25 ENCOUNTER — Telehealth: Payer: Self-pay | Admitting: Family Medicine

## 2014-12-25 DIAGNOSIS — M79672 Pain in left foot: Secondary | ICD-10-CM

## 2014-12-25 DIAGNOSIS — M79671 Pain in right foot: Secondary | ICD-10-CM

## 2014-12-25 NOTE — Telephone Encounter (Signed)
[facility] [place] once referral placed.

## 2014-12-25 NOTE — Telephone Encounter (Signed)
Referral entered  

## 2014-12-28 ENCOUNTER — Ambulatory Visit: Admitting: Physical Therapy

## 2015-01-09 ENCOUNTER — Emergency Department (HOSPITAL_BASED_OUTPATIENT_CLINIC_OR_DEPARTMENT_OTHER)
Admission: EM | Admit: 2015-01-09 | Discharge: 2015-01-09 | Disposition: A | Discharge status: Home / Self Care | Attending: Emergency Medicine | Admitting: Emergency Medicine

## 2015-01-09 ENCOUNTER — Emergency Department (HOSPITAL_BASED_OUTPATIENT_CLINIC_OR_DEPARTMENT_OTHER)

## 2015-01-09 ENCOUNTER — Encounter (HOSPITAL_BASED_OUTPATIENT_CLINIC_OR_DEPARTMENT_OTHER): Payer: Self-pay | Admitting: *Deleted

## 2015-01-09 DIAGNOSIS — Z91040 Latex allergy status: Secondary | ICD-10-CM | POA: Insufficient documentation

## 2015-01-09 DIAGNOSIS — R0789 Other chest pain: Secondary | ICD-10-CM | POA: Insufficient documentation

## 2015-01-09 DIAGNOSIS — M1990 Unspecified osteoarthritis, unspecified site: Secondary | ICD-10-CM | POA: Insufficient documentation

## 2015-01-09 DIAGNOSIS — R110 Nausea: Secondary | ICD-10-CM | POA: Diagnosis not present

## 2015-01-09 DIAGNOSIS — Z79899 Other long term (current) drug therapy: Secondary | ICD-10-CM | POA: Insufficient documentation

## 2015-01-09 DIAGNOSIS — R079 Chest pain, unspecified: Secondary | ICD-10-CM

## 2015-01-09 DIAGNOSIS — R61 Generalized hyperhidrosis: Secondary | ICD-10-CM | POA: Insufficient documentation

## 2015-01-09 DIAGNOSIS — E780 Pure hypercholesterolemia: Secondary | ICD-10-CM | POA: Diagnosis not present

## 2015-01-09 DIAGNOSIS — R51 Headache: Secondary | ICD-10-CM | POA: Diagnosis not present

## 2015-01-09 LAB — CBC WITH DIFFERENTIAL/PLATELET
Basophils Absolute: 0 10*3/uL (ref 0.0–0.1)
Basophils Relative: 1 % (ref 0–1)
EOS PCT: 1 % (ref 0–5)
Eosinophils Absolute: 0.1 10*3/uL (ref 0.0–0.7)
HCT: 40 % (ref 36.0–46.0)
HEMOGLOBIN: 12.9 g/dL (ref 12.0–15.0)
Lymphocytes Relative: 53 % — ABNORMAL HIGH (ref 12–46)
Lymphs Abs: 3.1 10*3/uL (ref 0.7–4.0)
MCH: 28.3 pg (ref 26.0–34.0)
MCHC: 32.3 g/dL (ref 30.0–36.0)
MCV: 87.7 fL (ref 78.0–100.0)
MONO ABS: 0.5 10*3/uL (ref 0.1–1.0)
MONOS PCT: 8 % (ref 3–12)
NEUTROS ABS: 2.2 10*3/uL (ref 1.7–7.7)
NEUTROS PCT: 37 % — AB (ref 43–77)
Platelets: 223 10*3/uL (ref 150–400)
RBC: 4.56 MIL/uL (ref 3.87–5.11)
RDW: 13.4 % (ref 11.5–15.5)
WBC: 5.9 10*3/uL (ref 4.0–10.5)

## 2015-01-09 LAB — COMPREHENSIVE METABOLIC PANEL
ALBUMIN: 4.6 g/dL (ref 3.5–5.0)
ALT: 23 U/L (ref 14–54)
AST: 23 U/L (ref 15–41)
Alkaline Phosphatase: 46 U/L (ref 38–126)
Anion gap: 9 (ref 5–15)
BUN: 14 mg/dL (ref 6–20)
CO2: 23 mmol/L (ref 22–32)
Calcium: 9.3 mg/dL (ref 8.9–10.3)
Chloride: 108 mmol/L (ref 101–111)
Creatinine, Ser: 0.7 mg/dL (ref 0.44–1.00)
Glucose, Bld: 76 mg/dL (ref 65–99)
POTASSIUM: 3.6 mmol/L (ref 3.5–5.1)
SODIUM: 140 mmol/L (ref 135–145)
TOTAL PROTEIN: 8 g/dL (ref 6.5–8.1)
Total Bilirubin: 0.7 mg/dL (ref 0.3–1.2)

## 2015-01-09 LAB — TROPONIN I

## 2015-01-09 MED ORDER — HYDROCODONE-ACETAMINOPHEN 5-325 MG PO TABS: 1.0000 | ORAL_TABLET | Freq: Four times a day (QID) | ORAL | Status: DC | PRN

## 2015-01-09 NOTE — ED Notes (Signed)
IV attempt x 1 unsuccessful. ?

## 2015-01-09 NOTE — ED Provider Notes (Signed)
CSN: [id number]     Arrival date & time 01/09/15  1355 History  This chart was scribed for Dr. Geoffery Lyons, by Lyndel Safe, ED Scribe. This patient was seen in room MH12/MH12 and the patient's care was started 3:43 PM.  Chief Complaint  Patient presents with  . Chest Pain   Patient is a 49 y.o. female presenting with chest pain. The history is provided by the patient and a relative. No language interpreter was used.  Chest Pain Pain location:  L chest and R chest Pain quality: sharp   Pain radiates to:  L arm Pain radiates to the back: no   Pain severity:  Moderate Onset quality:  Sudden Duration:  1 day Timing:  Constant Progression:  Unchanged Chronicity:  New Context: not breathing   Relieved by:  None tried Worsened by:  Certain positions Ineffective treatments:  None tried Associated symptoms: diaphoresis, headache and nausea   Associated symptoms: no abdominal pain, no cough, no fever, no shortness of breath and not vomiting     HPI Comments: [name] is a 49 y.o. female, with a PMhx of HLD, who presents to the Emergency Department complaining of constant, moderate, sharp, chest pain radiating down her left arm onset 1 day ago. She states associated headache, diaphoresis, and nausea. 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 The patient was taking Simvastatin for high cholesterol but has recently been taken off by her PCP. She has a PFhx of DM but denies any family history of cardiac pathologies. Pt denies any strenuous activity related to the chest pain. She also denies associated cough, SOB, nausea, diarrhea, abdominal pain, or dyspnea.   Past Medical History  Diagnosis Date  . Arthritis   . Hyperlipidemia 03/03/2014   Past Surgical History  Procedure Laterality Date  . Cholecystectomy    . Breast surgery      nodule in breast removed-not cancer  . Abdominal hysterectomy      partial-still has ovaries. Benign reason. Unsure  about cervix   . Neuroplasty / transposition median nerve at carpal tunnel bilateral Bilateral    Family History  Problem Relation Age of Onset  . Hypertension Mother   . Hypertension Father   . Diabetes Father   . Cancer Paternal Grandmother     unsure  . Cancer Paternal Grandfather     prostate  . Diabetes type II Daughter   . Healthy Son    History  Substance Use Topics  . Smoking status: Never Smoker   . Smokeless tobacco: Not on file  . Alcohol Use: No   OB History    No data available     Review of Systems  Constitutional: Positive for diaphoresis. Negative for fever and chills.  Respiratory: Negative for cough and shortness of breath.   Cardiovascular: Positive for chest pain.  Gastrointestinal: Positive for nausea. Negative for vomiting, abdominal pain and diarrhea.  Neurological: Positive for headaches.   A complete 10 system review of systems was obtained and is otherwise negative except at noted in the HPI and PMH.  Allergies  Darvocet; Percocet; Shrimp; and Latex  Home Medications   Prior to Admission medications   Medication Sig Start Date End Date Taking? Authorizing Provider  calcium carbonate (OS-CAL) 600 MG TABS tablet Take 600 mg by mouth 2 (two) times daily with a meal.    Historical Provider, MD  cholecalciferol (VITAMIN D) 1000 UNITS tablet Take 2,000 Units by  mouth daily.     Historical Provider, MD  cyclobenzaprine (FLEXERIL) 5 MG tablet Take 1 tablet (5 mg total) by mouth 3 (three) times daily as needed for muscle spasms. Patient not taking: Reported on 10/01/2014 11/18/13   Hayden Rasmussenavid Mabe, NP  meloxicam (MOBIC) 15 MG tablet Take 1 tablet (15 mg total) by mouth daily as needed for pain. Patient not taking: Reported on 10/01/2014 03/03/14   Shelva MajesticStephen O Hunter, MD  montelukast (SINGULAIR) 10 MG tablet Take 1 tablet (10 mg total) by mouth at bedtime. 03/03/14   Shelva MajesticStephen O Hunter, MD  predniSONE (DELTASONE) 20 MG tablet Take 2 tabs for 3 days, then 1 tab for 4  days 10/01/14   Shelva MajesticStephen O Hunter, MD  simvastatin (ZOCOR) 40 MG tablet Take 1 tablet (40 mg total) by mouth at bedtime. Take 1/2 tab for 1 week then full tab if tolerated. 03/04/14   Shelva MajesticStephen O Hunter, MD   Triage Vitals: BP 136/80 mmHg  Pulse 85  Temp(Src) 98.5 F (36.9 C) (Oral)  Resp 20  Ht 5\' 3"  (1.6 m)  Wt 208 lb (94.348 kg)  BMI 36.85 kg/m2  SpO2 97%   Physical Exam  Constitutional: She appears well-developed and well-nourished.  HENT:  Head: Normocephalic and atraumatic.  Eyes: Conjunctivae are normal. Right eye exhibits no discharge. Left eye exhibits no discharge.  Cardiovascular: Normal rate, regular rhythm and normal heart sounds.   Pulmonary/Chest: Effort normal and breath sounds normal. No respiratory distress. She exhibits tenderness.  There is tenderness to palpation of the anterior chest wall.   Abdominal: Soft. Bowel sounds are normal. She exhibits no distension. There is no tenderness. There is no rebound and no guarding.  Musculoskeletal: She exhibits no edema.  Neurological: She is alert. Coordination normal.  Skin: Skin is warm and dry. No rash noted. She is not diaphoretic. No erythema.  Psychiatric: She has a normal mood and affect.  Nursing note and vitals reviewed.  ED Course  Procedures  DIAGNOSTIC STUDIES: Oxygen Saturation is 97% on RA, normal by my interpretation.    COORDINATION OF CARE: 3:49 PM Discussed treatment plan which includes to order diagnostic lab with pt. Discussed unremarkable EKG and CXR. Pt acknowledges and agrees to plan.   Labs Review Labs Reviewed - No data to display  Imaging Review Dg Chest 2 View  01/09/2015   CLINICAL DATA:  Chest pain.  Fever.  EXAM: CHEST  2 VIEW  COMPARISON:  04/11/2013  FINDINGS: Heart size and pulmonary vascularity are normal. There is tortuosity of the thoracic aorta. The lungs are clear. No effusions. No osseous abnormality.  IMPRESSION: No significant abnormality.   Electronically Signed   By: Francene BoyersJames   Maxwell M.D.   On: 01/09/2015 15:05     EKG Interpretation   Date/Time:  Saturday January 09 2015 14:01:06 EDT Ventricular Rate:  94 PR Interval:  160 QRS Duration: 78 QT Interval:  348 QTC Calculation: 435 R Axis:   54 Text Interpretation:  Normal sinus rhythm with sinus arrhythmia Cannot  rule out Anterior infarct , age undetermined Abnormal ECG No previous  tracing Confirmed by POLLINA  MD, CHRISTOPHER [phone]([id number]) on 01/09/2015 2:05:04  PM      MDM   Final diagnoses:  None    Patient presents here with complaints of chest pain that is sharp in nature and worse with sitting forward. It is also reproducible with palpation of the anterior chest wall. Her workup reveals no acute EKG changes and troponin is negative. Her chest  x-ray is also unremarkable. I highly doubt a cardiac etiology and feel as though the patient is appropriate for discharge. I will recommend anti-inflammatory's and prescribe a small quantity of pain medication which she can take. She understands to return to the ER if her symptoms worsen and follow-up with her Dr. if not improving.   I personally performed the services described in this documentation, which was scribed in my presence. The recorded information has been reviewed and is accurate.     Geoffery Lyons, MD 01/09/15 [phone]

## 2015-01-09 NOTE — ED Notes (Signed)
Pt states that she has not been feeling well, she has had headache for a few days and its more today.  She had right sided CP yesterday and is now on left side, feels sharp and gets worse when sitting up.  Pt also reports some fever (100.4F0 x 2 days and recently had a tooth pulled. No sob

## 2015-01-09 NOTE — Discharge Instructions (Signed)
Ibuprofen 600 mg 3 times daily for the next 3 days. Hydrocodone as prescribed as needed for pain not relieved with ibuprofen.  Follow-up with your primary Dr. if not improving in the next 2-3 days, and return to the ER if your symptoms significantly worsen or change.   Chest Pain (Nonspecific) It is often hard to give a specific diagnosis for the cause of chest pain. There is always a chance that your pain could be related to something serious, such as a heart attack or a blood clot in the lungs. You need to follow up with your health care provider for further evaluation. CAUSES   Heartburn.  Pneumonia or bronchitis.  Anxiety or stress.  Inflammation around your heart (pericarditis) or lung (pleuritis or pleurisy).  A blood clot in the lung.  A collapsed lung (pneumothorax). It can develop suddenly on its own (spontaneous pneumothorax) or from trauma to the chest.  Shingles infection (herpes zoster virus). The chest wall is composed of bones, muscles, and cartilage. Any of these can be the source of the pain.  The bones can be bruised by injury.  The muscles or cartilage can be strained by coughing or overwork.  The cartilage can be affected by inflammation and become sore (costochondritis). DIAGNOSIS  Lab tests or other studies may be needed to find the cause of your pain. Your health care provider may have you take a test called an ambulatory electrocardiogram (ECG). An ECG records your heartbeat patterns over a 24-hour period. You may also have other tests, such as:  Transthoracic echocardiogram (TTE). During echocardiography, sound waves are used to evaluate how blood flows through your heart.  Transesophageal echocardiogram (TEE).  Cardiac monitoring. This allows your health care provider to monitor your heart rate and rhythm in real time.  Holter monitor. This is a portable device that records your heartbeat and can help diagnose heart arrhythmias. It allows your health  care provider to track your heart activity for several days, if needed.  Stress tests by exercise or by giving medicine that makes the heart beat faster. TREATMENT   Treatment depends on what may be causing your chest pain. Treatment may include:  Acid blockers for heartburn.  Anti-inflammatory medicine.  Pain medicine for inflammatory conditions.  Antibiotics if an infection is present.  You may be advised to change lifestyle habits. This includes stopping smoking and avoiding alcohol, caffeine, and chocolate.  You may be advised to keep your head raised (elevated) when sleeping. This reduces the chance of acid going backward from your stomach into your esophagus. Most of the time, nonspecific chest pain will improve within 2-3 days with rest and mild pain medicine.  HOME CARE INSTRUCTIONS   If antibiotics were prescribed, take them as directed. Finish them even if you start to feel better.  For the next few days, avoid physical activities that bring on chest pain. Continue physical activities as directed.  Do not use any tobacco products, including cigarettes, chewing tobacco, or electronic cigarettes.  Avoid drinking alcohol.  Only take medicine as directed by your health care provider.  Follow your health care provider's suggestions for further testing if your chest pain does not go away.  Keep any follow-up appointments you made. If you do not go to an appointment, you could develop lasting (chronic) problems with pain. If there is any problem keeping an appointment, call to reschedule. SEEK MEDICAL CARE IF:   Your chest pain does not go away, even after treatment.  You have a  rash with blisters on your chest.  You have a fever. SEEK IMMEDIATE MEDICAL CARE IF:   You have increased chest pain or pain that spreads to your arm, neck, jaw, back, or abdomen.  You have shortness of breath.  You have an increasing cough, or you cough up blood.  You have severe back or  abdominal pain.  You feel nauseous or vomit.  You have severe weakness.  You faint.  You have chills. This is an emergency. Do not wait to see if the pain will go away. Get medical help at once. Call your local emergency services (911 in [place] Do not drive yourself to the hospital. MAKE SURE YOU:   Understand these instructions.  Will watch your condition.  Will get help right away if you are not doing well or get worse. Document Released: 05/03/2005 Document Revised: 07/29/2013 Document Reviewed: 02/27/2008 [facility] Patient Information 2015 [place], [place]. This information is not intended to replace advice given to you by your health care provider. Make sure you discuss any questions you have with your health care provider.

## 2015-01-18 ENCOUNTER — Ambulatory Visit (INDEPENDENT_AMBULATORY_CARE_PROVIDER_SITE_OTHER): Admitting: Adult Health

## 2015-01-18 ENCOUNTER — Encounter: Payer: Self-pay | Admitting: Adult Health

## 2015-01-18 VITALS — BP 124/88 | HR 121 | Temp 98.6°F | Wt 201.4 lb

## 2015-01-18 DIAGNOSIS — R000 Tachycardia, unspecified: Secondary | ICD-10-CM | POA: Diagnosis not present

## 2015-01-18 DIAGNOSIS — R61 Generalized hyperhidrosis: Secondary | ICD-10-CM | POA: Diagnosis not present

## 2015-01-18 DIAGNOSIS — Z111 Encounter for screening for respiratory tuberculosis: Secondary | ICD-10-CM | POA: Diagnosis not present

## 2015-01-18 NOTE — Progress Notes (Signed)
Pre visit review using our clinic review tool, if applicable. No additional management support is needed unless otherwise documented below in the visit note. 

## 2015-01-18 NOTE — Patient Instructions (Addendum)
I will follow up with you regarding the chest xray and labs.   You can try over the counter Black Cohosh - which helps with night sweats/hot flashes. Switching to soy products and B complex vitamins may also work to help with these issues.   As discussed please keep a journal of your temperature before you go to bed and after you have had night sweats.   Follow up with Dr. Durene Cal.

## 2015-01-18 NOTE — Progress Notes (Signed)
Subjective:    Patient ID: [name], female    DOB: [birth year], 49 y.o.   MRN: [id number]  HPI  Patient presents to the office today for night sweats. She endorses that she has night sweats every night, she soaks through the sheets. Denies fevers, but that she does hot check her temperature during this.   Had partial hysteresctomy- took uterus but not ovaries . Denies any travel out of the country. Does not work in prison, has not been in prison or community housing.   Endorses 12 pound weight loss over the last two months. No N/V/D  Review of Systems  Unable to perform ROS Constitutional: Positive for activity change. Negative for fever, chills and fatigue.  HENT: Negative.   Respiratory: Negative.   Cardiovascular: Negative.   Endocrine: Positive for heat intolerance.  Genitourinary: Negative.   Musculoskeletal: Positive for myalgias, back pain and arthralgias. Negative for neck pain and neck stiffness.  All other systems reviewed and are negative.  Past Medical History  Diagnosis Date  . Arthritis   . Hyperlipidemia 03/03/2014    History   Social History  . Marital Status: Married    Spouse Name: N/A  . Number of Children: N/A  . Years of Education: N/A   Occupational History  . Not on file.   Social History Main Topics  . Smoking status: Never Smoker   . Smokeless tobacco: Not on file  . Alcohol Use: No  . Drug Use: No  . Sexual Activity: Yes   Other Topics Concern  . Not on file   Social History Narrative   Married 21 years in 2015. Lives with 5 people. 2 kids. No grandkids. Husband is a Education officer, environmental. 3 pregnancies and 2 live births   Owns daycare for 8 years, drive a bus, and Geologist, engineering for [employer], basketball, walking, reading Bible, people person   Fredderick Severance MD [facility] past MD-out of network now.                 Past Surgical History  Procedure Laterality Date  . Cholecystectomy    . Breast surgery     nodule in breast removed-not cancer  . Abdominal hysterectomy      partial-still has ovaries. Benign reason. Unsure about cervix   . Neuroplasty / transposition median nerve at carpal tunnel bilateral Bilateral     Family History  Problem Relation Age of Onset  . Hypertension Mother   . Hypertension Father   . Diabetes Father   . Cancer Paternal Grandmother     unsure  . Cancer Paternal Grandfather     prostate  . Diabetes type II Daughter   . Healthy Son     Allergies  Allergen Reactions  . Darvocet [Propoxyphene N-Acetaminophen] Nausea And Vomiting  . Percocet [Oxycodone-Acetaminophen] Nausea And Vomiting  . Shrimp [Shellfish Allergy] Hives and Itching  . Latex Itching and Rash    Current Outpatient Prescriptions on File Prior to Visit  Medication Sig Dispense Refill  . calcium carbonate (OS-CAL) 600 MG TABS tablet Take 600 mg by mouth 2 (two) times daily with a meal.    . cholecalciferol (VITAMIN D) 1000 UNITS tablet Take 2,000 Units by mouth daily.     . cyclobenzaprine (FLEXERIL) 5 MG tablet Take 1 tablet (5 mg total) by mouth 3 (three) times daily as needed for muscle spasms. 20 tablet 0  . meloxicam (MOBIC) 15 MG tablet Take 1 tablet (15 mg total)  by mouth daily as needed for pain. 30 tablet 3  . montelukast (SINGULAIR) 10 MG tablet Take 1 tablet (10 mg total) by mouth at bedtime. 30 tablet 11  . HYDROcodone-acetaminophen (NORCO) 5-325 MG per tablet Take 1-2 tablets by mouth every 6 (six) hours as needed. (Patient not taking: Reported on 01/18/2015) 10 tablet 0  . predniSONE (DELTASONE) 20 MG tablet Take 2 tabs for 3 days, then 1 tab for 4 days (Patient not taking: Reported on 01/18/2015) 10 tablet 0  . simvastatin (ZOCOR) 40 MG tablet Take 1 tablet (40 mg total) by mouth at bedtime. Take 1/2 tab for 1 week then full tab if tolerated. (Patient not taking: Reported on 01/18/2015) 30 tablet 5   No current facility-administered medications on file prior to visit.    BP  124/88 mmHg  Pulse 121  Temp(Src) 98.6 F (37 C) (Oral)  Wt 201 lb 6.4 oz (91.354 kg)       Objective:   Physical Exam  Constitutional: She is oriented to person, place, and time. She appears well-developed and well-nourished.  HENT:  Head: Normocephalic and atraumatic.  Right Ear: External ear normal.  Left Ear: External ear normal.  Nose: Nose normal.  Mouth/Throat: Oropharynx is clear and moist. No oropharyngeal exudate.  Eyes: Right eye exhibits no discharge. Left eye exhibits no discharge. No scleral icterus.  Cardiovascular: Regular rhythm, normal heart sounds and intact distal pulses.  Exam reveals no gallop and no friction rub.   No murmur heard. Tachycardic during exam   Pulmonary/Chest: Effort normal and breath sounds normal. No respiratory distress. She has no wheezes. She has no rales. She exhibits no tenderness.  Abdominal: Soft. Bowel sounds are normal. She exhibits no distension and no mass. There is no tenderness. There is no rebound and no guarding.  Musculoskeletal: Normal range of motion.  Lymphadenopathy:    She has no cervical adenopathy.  Neurological: She is alert and oriented to person, place, and time. She has normal reflexes.  Skin: Skin is warm and dry. No rash noted. She is not diaphoretic. No erythema. No pallor.  Psychiatric: She has a normal mood and affect. Her behavior is normal. Judgment and thought content normal.  Vitals reviewed.      Assessment & Plan:   1. Tachycardia - EKG 12-Lead - NSR, rate 77 - TSH  2. Night sweats Likely menopausal symptoms, r/o TB, lymphoma, or other etiology. Not likely from medications.  - TSH - CBC with Differential - DG Chest 2 View; Future - HIV antibody - PPD - Follow up with Dr. Durene Cal - Can trial [facility], switch to soy products and vitamin B 12  3. Screening for tuberculosis - DG Chest 2 View; Future - HIV antibody - PPD

## 2015-01-19 ENCOUNTER — Ambulatory Visit (INDEPENDENT_AMBULATORY_CARE_PROVIDER_SITE_OTHER)
Admission: RE | Admit: 2015-01-19 | Discharge: 2015-01-19 | Disposition: A | Discharge status: Home / Self Care | Source: Ambulatory Visit | Attending: Adult Health | Admitting: Adult Health

## 2015-01-19 ENCOUNTER — Telehealth: Payer: Self-pay | Admitting: Adult Health

## 2015-01-19 DIAGNOSIS — R61 Generalized hyperhidrosis: Secondary | ICD-10-CM | POA: Diagnosis not present

## 2015-01-19 DIAGNOSIS — Z111 Encounter for screening for respiratory tuberculosis: Secondary | ICD-10-CM | POA: Diagnosis not present

## 2015-01-19 LAB — CBC WITH DIFFERENTIAL/PLATELET
BASOS ABS: 0 10*3/uL (ref 0.0–0.1)
Basophils Relative: 1 % (ref 0.0–3.0)
EOS PCT: 1.4 % (ref 0.0–5.0)
Eosinophils Absolute: 0.1 10*3/uL (ref 0.0–0.7)
HEMATOCRIT: 36.8 % (ref 36.0–46.0)
Hemoglobin: 12.1 g/dL (ref 12.0–15.0)
LYMPHS PCT: 53.1 % — AB (ref 12.0–46.0)
Lymphs Abs: 2.5 10*3/uL (ref 0.7–4.0)
MCHC: 32.9 g/dL (ref 30.0–36.0)
MCV: 86.6 fl (ref 78.0–100.0)
MONOS PCT: 7.1 % (ref 3.0–12.0)
Monocytes Absolute: 0.3 10*3/uL (ref 0.1–1.0)
Neutro Abs: 1.8 10*3/uL (ref 1.4–7.7)
Neutrophils Relative %: 37.4 % — ABNORMAL LOW (ref 43.0–77.0)
PLATELETS: 284 10*3/uL (ref 150.0–400.0)
RBC: 4.25 Mil/uL (ref 3.87–5.11)
RDW: 13 % (ref 11.5–15.5)
WBC: 4.7 10*3/uL (ref 4.0–10.5)

## 2015-01-19 LAB — TSH: TSH: 0.42 u[IU]/mL (ref 0.35–4.50)

## 2015-01-19 LAB — HIV ANTIBODY (ROUTINE TESTING W REFLEX): HIV 1&2 Ab, 4th Generation: NONREACTIVE

## 2015-01-19 NOTE — Telephone Encounter (Signed)
She has no phone number on file to call with results. Will send via [employer]

## 2015-01-20 LAB — TB SKIN TEST
INDURATION: 0 mm
TB Skin Test: NEGATIVE

## 2015-02-04 ENCOUNTER — Ambulatory Visit (INDEPENDENT_AMBULATORY_CARE_PROVIDER_SITE_OTHER): Admitting: Family Medicine

## 2015-02-04 ENCOUNTER — Encounter: Payer: Self-pay | Admitting: Family Medicine

## 2015-02-04 VITALS — BP 110/80 | HR 76 | Temp 98.5°F | Wt 204.0 lb

## 2015-02-04 DIAGNOSIS — R0789 Other chest pain: Secondary | ICD-10-CM | POA: Diagnosis not present

## 2015-02-04 DIAGNOSIS — R61 Generalized hyperhidrosis: Secondary | ICD-10-CM | POA: Insufficient documentation

## 2015-02-04 DIAGNOSIS — M791 Myalgia, unspecified site: Secondary | ICD-10-CM

## 2015-02-04 MED ORDER — VENLAFAXINE HCL ER 37.5 MG PO CP24
37.5000 mg | ORAL_CAPSULE | Freq: Every day | ORAL | Status: DC
Start: 2015-02-04 — End: 2016-01-11

## 2015-02-04 NOTE — Patient Instructions (Signed)
Night sweats-may be menopausal. Trial venlafaxine 37.5 mg extended release  Sorry for all the issues you are having with pain. COntinue to follow with Dr. Ethelene Halamos and the foot doctor.   Let's check in a month from now, may go up on the venlafaxine potentially

## 2015-02-04 NOTE — Assessment & Plan Note (Signed)
ED visit 01/09/15 for sharp right left chest and Left arm pain that resolved. No recurrence. Reassuring ekg and negative troponin. We discussed if recurrence we would consider cardiac workup. Her main risk factor is hyperlipidemia though so we opted not to stress test currently with everything else she has going on right now.

## 2015-02-04 NOTE — Progress Notes (Signed)
Haley Reddish, MD  Subjective:  [name] is a 49 y.o. year old very pleasant female patient who presents with:  Night Sweats -Saw Haley Peng, NP 2 weeks ago for night sweats. Partial hysterectomy-still has ovaries. 12 lbs weight loss over 2 months reportedly.  On our scales weights have ranged from 204-212 over last year. Weight up 3 lbs today. HadLabs-TSh, CBC, HIV, PPD. CXR largely normal.   Black cohosh trial advised-did not duse, soy products advised-did not use, b12. Patient decided to start Taking estroven OTC  Chest Pain Before that saw ED 01/09/15 for chest pain and had reassuring EKG and negative troponin and was discharged to home. 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 She has had no recurrence of pain.   Chronic Pain Burning in pain for years with largely normal labs led to neurology visit. At neurolgoy, noted to have multilevel lumbosacral impingement but too mild to manifest with severity of symptoms. Symptoms had transitioned to myalgias and arthralgias by time saw neurology and back pain had resolved. No neurological cause found 9imaging, EMG, and laboratory testing for HbA1c, TSH, vitamin B12, HIV, RPR, SPEP with IFE, ANA, RF, ESR which has been non-diagnostic). Cymbalta, gabapentin, nortriptyline had not been helpful and had side effectsions.   Referred to rheum who did not find autoimmune cause of pain and referred to ortho. Does have achilles tendonitis and plantar fasciitis. Patient then saw Dr. Doran Robertson before seeing [name] podiatry. Has also seen Dr. Nelva Robertson- [facility]. Stated needed surgery-patient declined  ROS-no fecal or urinary incontinence, subjective leg weakness as noted in past but not confirmed on exam, no shortness of breath, recent chest pain other than at ED visit. No abnormal fatigue. Weight has increased- no further unintentional weight loss.   Past Medical History- HLD now off statin due to myalgia concern, arthritis.   Medications- reviewed and  updated Current Outpatient Prescriptions  Medication Sig Dispense Refill  . calcium carbonate (OS-CAL) 600 MG TABS tablet Take 600 mg by mouth 2 (two) times daily with a meal.    . cholecalciferol (VITAMIN D) 1000 UNITS tablet Take 2,000 Units by mouth daily.     . montelukast (SINGULAIR) 10 MG tablet Take 1 tablet (10 mg total) by mouth at bedtime. (Patient not taking: Reported on 02/04/2015) 30 tablet 11  Hydrocodone 25m from the dentist  Objective: BP 110/80 mmHg  Pulse 76  Temp(Src) 98.5 F (36.9 C)  Wt 204 lb (92.534 kg) Gen: NAD, resting comfortably in chair CV: RRR no murmurs rubs or gallops Lungs: CTAB no crackles, wheeze, rhonchi Abdomen: soft/nontender/nondistended/normal bowel sounds.  Ext: no edema, walks with slight limp off L foot Skin: warm, dry, no rash Neuro: grossly normal, moves all extremities   Assessment/Plan:  Night sweats Likely menopausal. Discussed options. Patient concerned about HRT risks. We will trial venlafaxine XR 37.5 mg- increase at a month to 783mlikely.   Atypical chest pain ED visit 01/09/15 for sharp right left chest and Left arm pain that resolved. No recurrence. Reassuring ekg and negative troponin. We discussed if recurrence we would consider cardiac workup. Her main risk factor is hyperlipidemia though so we opted not to stress test currently with everything else she has going on right now.    Myalgia Chronic issues for years with multiple specialities involved (neuro, rheum, ortho, podiatry). Dentistry had given her some vicodin and she requests refill today. I declined and told patient did not think that was a good path for her with her chronic  pain. Advised return to Dr. Nelva Robertson for repeat injections as well as to podiatry.    Meds ordered this encounter  Medications  . venlafaxine XR (EFFEXOR-XR) 37.5 MG 24 hr capsule    Sig: Take 1 capsule (37.5 mg total) by mouth daily with breakfast.    Dispense:  30 capsule    Refill:  5   >50%  of 25 minute office visit was spent on counseling (dealing with chronic pain, reasons to return to care, difficulties of managing night sweats, low likelihood that night sweats malignancy related)  and coordination of care

## 2015-02-04 NOTE — Assessment & Plan Note (Signed)
Likely menopausal. Discussed options. Patient concerned about HRT risks. We will trial venlafaxine XR 37.5 mg- increase at a month to  likely.

## 2015-02-04 NOTE — Assessment & Plan Note (Signed)
Chronic issues for years with multiple specialities involved (neuro, rheum, ortho, podiatry). Dentistry had given her some vicodin and she requests refill today. I declined and told patient did not think that was a good path for her with her chronic pain. Advised return to Dr. Ethelene Halamos for repeat injections as well as to podiatry.

## 2015-03-02 LAB — HM MAMMOGRAPHY: HM Mammogram: NORMAL

## 2015-03-03 ENCOUNTER — Encounter: Payer: Self-pay | Admitting: Family Medicine

## 2015-08-25 ENCOUNTER — Ambulatory Visit (INDEPENDENT_AMBULATORY_CARE_PROVIDER_SITE_OTHER): Admitting: Family Medicine

## 2015-08-25 ENCOUNTER — Encounter: Payer: Self-pay | Admitting: Family Medicine

## 2015-08-25 VITALS — BP 117/76 | HR 81 | Temp 98.5°F | Ht 63.0 in | Wt 208.0 lb

## 2015-08-25 DIAGNOSIS — M25579 Pain in unspecified ankle and joints of unspecified foot: Secondary | ICD-10-CM

## 2015-08-25 MED ORDER — DICLOFENAC SODIUM 75 MG PO TBEC: 75.0000 mg | DELAYED_RELEASE_TABLET | Freq: Two times a day (BID) | ORAL | Status: DC | PRN

## 2015-08-25 NOTE — Progress Notes (Signed)
   Subjective:    Patient ID: [name], female    DOB: Jan 14, 1966, 50 y.o.   MRN: [id number]  HPI Here asking for advice about chronic foot pain. She has had painful feet for several years and for the past year has been seeing a Risk analyst in [place]. She has worn special molded arch supports, she has tried several medications, and she as had cortisone shots but nothing seems to help. The pain is centered around the posterior heels and Achilles tendons in both feet, but the entire foot hurts. She has also asked Dr. Ethelene Hal (who treats her back pain) about this, and he has no specific suggestions. She has never asked Dr. Durene Cal about this.    Review of Systems  Constitutional: Negative.   Musculoskeletal: Positive for arthralgias and gait problem. Negative for joint swelling.       Objective:   Physical Exam  Constitutional: She appears well-developed and well-nourished.  Musculoskeletal:  Both feet are very tender especially at the insertion of both Achilles tendons into the heels. No swelling          Assessment & Plan:  Chronic foot pain. I will give her Diclofenac to help with inflammation and pain, but she will need to see Dr. Durene Cal about a long term plan for this. Perhaps referral to an Orthopedist who specializes in the lower legs would help.

## 2015-08-25 NOTE — Progress Notes (Signed)
Pre visit review using our clinic review tool, if applicable. No additional management support is needed unless otherwise documented below in the visit note. 

## 2015-08-26 ENCOUNTER — Ambulatory Visit (INDEPENDENT_AMBULATORY_CARE_PROVIDER_SITE_OTHER): Admitting: Family Medicine

## 2015-08-26 ENCOUNTER — Encounter: Payer: Self-pay | Admitting: Family Medicine

## 2015-08-26 VITALS — BP 122/70 | HR 97 | Temp 98.2°F | Wt 207.0 lb

## 2015-08-26 DIAGNOSIS — G8929 Other chronic pain: Secondary | ICD-10-CM | POA: Diagnosis not present

## 2015-08-26 DIAGNOSIS — E785 Hyperlipidemia, unspecified: Secondary | ICD-10-CM | POA: Diagnosis not present

## 2015-08-26 DIAGNOSIS — M79673 Pain in unspecified foot: Secondary | ICD-10-CM | POA: Diagnosis not present

## 2015-08-26 NOTE — Assessment & Plan Note (Signed)
S: suspect poorly controlled on no medicine. She was having night sweats which apparently resolved off simvastatin   A/P: not a typical reaction. We will keep her off and repeat lipids- encouraged CPE within 6 months- consider alternative statin potentially.

## 2015-08-26 NOTE — Assessment & Plan Note (Signed)
S: Patient has had intermittent burning pain in bilateral lower extremities starting over a year ago. She has had extensive workup including 2 different podiatrists, ortho, rheumatology, neurology. Workup has been largely negative. Did previously have some low back pain and had imaging of low back but degenerative changes did nto account for level of pain. Most recently pain had migrated to an aching in her legs while burning improved some (as of our last visit). Since that time she has had worsening foot pain mainly just medial of achilles insertion point on bilateral feet but also in achilles tendon. Saw podiatry and treated for plantar fasciitis and tendonitis. She was placed in heel lifts. She was encouraged to get custom inserts and return in 1 month as of 02/2015. She was told to stay off her feet due to severity of pain. She was out of work for at least a month per podiatry instructions. 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 She has trouble walking when she gets up due to severe pain.  A/P: Patient asks for my opinion on treatment. I advised her to return to podiatry where she was instructed to return about 5 months ago. She asks me to fill out short term disability paperwork for her time off- I declined as I did not take her out of work or see her in this time frame- podiatry should complete this since they took her out of work. Given her shifting patterns of pain- I encouraged her to discuss pain management with podiatry and I would be willing to refer her to see if they have any other opinion on modalities to treat pain outside of narcotics- prior notes mention list of medications she has been on to try to help without relief. Currently on voltaren as given by Dr. Clent Ridges yesterday- she will continue this for now- but should not take with mobic

## 2015-08-26 NOTE — Patient Instructions (Addendum)
Reviewed visit from podiatry in 02/2015. He wanted you to follow up within 4 weeks. I would return to see him. Since he took you out of work- I would ask that he fills out the disability paperwork you are requesting.   I am sorry you continue to have pain. You can use the voltaren Dr. Clent Ridges gave you or mobic but do not use both at same time.   We need to see each other sometime in the next 6 months to focus on chronic medical conditions outside of the foot pain which is really a podiatry issues. I would ask their opinion on pain management- I can place a referral for that if you would like to see if they can help with chronic foot and leg pain

## 2015-08-26 NOTE — Progress Notes (Signed)
Tana Conch, MD  Subjective:  [name] is a 50 y.o. year old very pleasant female patient who presents for/with See problem oriented charting ROS- No chest pain or shortness of breath. No headache or blurry vision. No leg weakness or fecal or urinary incontinence  Past Medical History-  Patient Active Problem List   Diagnosis Date Noted  . Chronic foot pain 08/26/2015    Priority: Medium  . Myalgia 04/10/2014    Priority: Medium  . Hyperlipidemia 03/03/2014    Priority: Medium  . Headache(784.0) 04/03/2014    Priority: Low  . Insomnia 04/03/2014    Priority: Low  . Obesity 04/03/2014    Priority: Low  . Arthritis 03/03/2014    Priority: Low  . Seasonal allergies 03/03/2014    Priority: Low  . Night sweats 02/04/2015  . Atypical chest pain 02/04/2015    Medications- reviewed and updated Current Outpatient Prescriptions  Medication Sig Dispense Refill  . calcium carbonate (OS-CAL) 600 MG TABS tablet Take 600 mg by mouth at bedtime.     . cholecalciferol (VITAMIN D) 1000 UNITS tablet Take 2,000 Units by mouth daily.     . montelukast (SINGULAIR) 10 MG tablet Take 1 tablet (10 mg total) by mouth at bedtime. 30 tablet 11  . venlafaxine XR (EFFEXOR-XR) 37.5 MG 24 hr capsule Take 1 capsule (37.5 mg total) by mouth daily with breakfast. 30 capsule 5  . diclofenac (VOLTAREN) 75 MG EC tablet Take 1 tablet (75 mg total) by mouth 2 (two) times daily as needed for moderate pain. 60 tablet 0   No current facility-administered medications for this visit.    Objective: BP 122/70 mmHg  Pulse 97  Temp(Src) 98.2 F (36.8 C)  Wt 207 lb (93.895 kg) Gen: NAD, resting comfortably CV: RRR no murmurs rubs or gallops Lungs: CTAB no crackles, wheeze, rhonchi Abdomen: soft/nontender/nondistended/normal bowel sounds. No rebound or guarding.  Ext: no edema Skin: warm, dry Neuro: grossly normal, moves all extremities  MSK: extreme pain medial to achilles insertion as well as along  achilles. No pain with palpation of plantar fascia.   Assessment/Plan:  Chronic foot pain S: Patient has had intermittent burning pain in bilateral lower extremities starting over a year ago. She has had extensive workup including 2 different podiatrists, ortho, rheumatology, neurology. Workup has been largely negative. Did previously have some low back pain and had imaging of low back but degenerative changes did nto account for level of pain. Most recently pain had migrated to an aching in her legs while burning improved some (as of our last visit). Since that time she has had worsening foot pain mainly just medial of achilles insertion point on bilateral feet but also in achilles tendon. Saw podiatry and treated for plantar fasciitis and tendonitis. She was placed in heel lifts. She was encouraged to get custom inserts and return in 1 month as of 02/2015. She was told to stay off her feet due to severity of pain. She was out of work for at least a month per podiatry instructions. 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 She has trouble walking when she gets up due to severe pain.  A/P: Patient asks for my opinion on treatment. I advised her to return to podiatry where she was instructed to return about 5 months ago. She asks me to fill out short term disability paperwork for her time  off- I declined as I did not take her out of work or see her in this time frame- podiatry should complete this since they took her out of work. Given her shifting patterns of pain- I encouraged her to discuss pain management with podiatry and I would be willing to refer her to see if they have any other opinion on modalities to treat pain outside of narcotics- prior notes mention list of medications she has been on to try to help without relief. Currently on voltaren as given by Dr. Clent Ridges yesterday- she will continue this for  now- but should not take with mobic   Hyperlipidemia S: suspect poorly controlled on no medicine. She was having night sweats which apparently resolved off simvastatin   A/P: not a typical reaction. We will keep her off and repeat lipids- encouraged CPE within 6 months- consider alternative statin potentially.

## 2015-10-12 ENCOUNTER — Ambulatory Visit: Attending: Podiatrist

## 2015-10-12 DIAGNOSIS — M79672 Pain in left foot: Secondary | ICD-10-CM

## 2015-10-12 DIAGNOSIS — R262 Difficulty in walking, not elsewhere classified: Secondary | ICD-10-CM | POA: Insufficient documentation

## 2015-10-12 DIAGNOSIS — Z789 Other specified health status: Secondary | ICD-10-CM

## 2015-10-12 DIAGNOSIS — M7661 Achilles tendinitis, right leg: Secondary | ICD-10-CM | POA: Insufficient documentation

## 2015-10-12 DIAGNOSIS — M79671 Pain in right foot: Secondary | ICD-10-CM | POA: Diagnosis present

## 2015-10-12 DIAGNOSIS — M7662 Achilles tendinitis, left leg: Secondary | ICD-10-CM | POA: Diagnosis present

## 2015-10-12 DIAGNOSIS — M6281 Muscle weakness (generalized): Secondary | ICD-10-CM | POA: Insufficient documentation

## 2015-10-12 DIAGNOSIS — R531 Weakness: Secondary | ICD-10-CM

## 2015-10-12 DIAGNOSIS — Z7409 Other reduced mobility: Secondary | ICD-10-CM | POA: Insufficient documentation

## 2015-10-12 NOTE — Therapy (Signed)
[facility] [address] [place], [place], [address] Phone: [phone]   Fax:  [phone]  Physical Therapy Evaluation  Patient Details  Name: [name] MRN: [id number] Date of Birth: 30-Jun-1966 Referring Provider: Dr Elodia FlorenceWalter Zalasko  Encounter Date: 10/12/2015      PT End of Session - 10/12/15 1549    Visit Number 1   Number of Visits 24   Date for PT Re-Evaluation 12/07/15   PT Start Time 1330   PT Stop Time 1420   PT Time Calculation (min) 50 min   Activity Tolerance Patient tolerated treatment well   Behavior During Therapy [facility] for tasks assessed/performed      Past Medical History  Diagnosis Date  . Arthritis   . Hyperlipidemia 03/03/2014    Past Surgical History  Procedure Laterality Date  . Cholecystectomy    . Breast surgery      nodule in breast removed-not cancer  . Abdominal hysterectomy      partial-still has ovaries. Benign reason. Unsure about cervix   . Neuroplasty / transposition median nerve at carpal tunnel bilateral Bilateral     There were no vitals filed for this visit.  Visit Diagnosis:  Achilles tendinitis of both lower extremities - Plan: PT plan of care cert/re-cert  Difficulty in walking - Plan: PT plan of care cert/re-cert  Weakness - Plan: PT plan of care cert/re-cert  Heel pain, bilateral - Plan: PT plan of care cert/re-cert  Impaired mobility and ADLs - Plan: PT plan of care cert/re-cert      Subjective Assessment - 10/12/15 1341    Subjective Pt reports bilat heel and foot pain ( Pain on L greater than R)  that began last year June 2016. Pain has increased since then, and pt has difficulty walking and severe pain with standing. Worse in AM, better with sitting. Pt was put into walking boot on L LE by MD on 09/13/15 and since then, the boot  has "helped".  Pt instructed PT not to touch her heels due to painful to touch.   Pertinent History "shots in back" and arthritis in spine.     Limitations Standing;Walking;House hold activities;Lifting   How long can you sit comfortably? unlimited in comfortable chair.    How long can you stand comfortably? 15 min   How long can you walk comfortably? 5 mins    Patient Stated Goals Return to walking normally, join a gym. "get my feet right", Return to work     Currently in Pain? Yes   Pain Score 8    Pain Location Heel   Pain Orientation Right;Left   Pain Descriptors / Indicators Numbness;Sharp;Throbbing   Pain Type Chronic pain;Acute pain   Pain Onset More than a month ago   Pain Frequency Constant   Aggravating Factors  walking, in AM    Pain Relieving Factors ice, medication, elevate            [facility] PT Assessment - 10/12/15 0001    Assessment   Medical Diagnosis Bilat heel pain    Referring Provider Dr Elodia FlorenceWalter Zalasko   Onset Date/Surgical Date 01/06/16   Hand Dominance Right   Next MD Visit 10/21/15   Prior Therapy PT last year 2016  "couple of weeks"    Precautions   Precautions None   Restrictions   Weight Bearing Restrictions No   Balance Screen   Has the patient fallen in the past 6 months No   Home Environment  Living Environment Private residence   Prior Function   Level of Independence Independent   Cognition   Overall Cognitive Status Within Functional Limits for tasks assessed   Observation/Other Assessments   Other Surveys  Other Surveys   ROM / Strength   AROM / PROM / Strength AROM;Strength   AROM   AROM Assessment Site Ankle   Right/Left Ankle Right;Left   Right Ankle Dorsiflexion -10   Right Ankle Plantar Flexion 45   Right Ankle Inversion 30   Right Ankle Eversion 13   Left Ankle Dorsiflexion -15   Left Ankle Plantar Flexion 45   Left Ankle Inversion 35   Left Ankle Eversion 0   Strength   Strength Assessment Site Ankle   Right/Left Ankle Right;Left   Right Ankle Dorsiflexion 3+/5   Right Ankle Plantar Flexion 2-/5   Right Ankle Inversion 3+/5   Right Ankle Eversion 3+/5    Left Ankle Dorsiflexion 3-/5   Left Ankle Plantar Flexion 1/5   Left Ankle Inversion 3-/5   Left Ankle Eversion 3-/5   Palpation   Palpation comment TP at posterior heel- pt would not let PT touch heels.    Ambulation/Gait   Ambulation Distance (Feet) 70 Feet   Assistive device Straight cane  Instruction for use of SPC.   Gait Pattern Step-to pattern   Ambulation Surface Level   Gait Comments Pt presented to appt without A.D. with antalgic gait. PT instructed pt in use of SPC and recommended pt use one, and pt was agreeable. PT called MD office to request script for cane and left request with Marchelle Folks at Dr Janece Canterbury office  Pt wearing walking boot on L LE.                   [facility] Adult PT Treatment/Exercise - 10/12/15 0001    Exercises   Exercises Ankle   Ankle Exercises: Seated   ABC's 1 rep  HEP   Ankle Circles/Pumps 10 reps  HEP   Other Seated Ankle Exercises DF/PF, Inv/Ev, 10 x each   HEP                PT Education - 10/12/15 1539    Education provided Yes   Education Details PT POC, recommended cane, Bring in script from MD for cane, REST, Ice, Elevation, Prioritize self and rehab, HEP with ankle 4 way AROM, circles, and ABCs. PT called MD to request script for [facility] be faxed to out PT office in order to distribute cane to pt. Spoke with Marchelle Folks, who was agreeable to fax script back.    Person(s) Educated Patient   Methods Explanation   Comprehension Verbalized understanding          PT Short Term Goals - 10/12/15 1606    PT SHORT TERM GOAL #1   Title Pt will be I with initial HEP for continued strengthening and mobility by 11/12/15.   Time 4   Period Weeks   Status New   PT SHORT TERM GOAL #2   Title Bilat ankle DF AROM will improve to 0 degrees pain-free in order to stand for 5 mins without pain 11/22/15.    Time 4   Period Weeks   Status New   PT SHORT TERM GOAL #3   Title Tenderness at bilat heels will diminish from severe to minimal in  order to tolerate palpation and pressure to posterior heel by 11/12/15.    Time 4   Period Weeks   Status New  PT Long Term Goals - 10/12/15 1609    PT LONG TERM GOAL #1   Title Bilat  ankle PF strength will improve to 3+/5 in order to ascend stairs and walk for 15 mins pain free by 12/07/15  .    Time 8   Period Weeks   Status New   PT LONG TERM GOAL #2   Title Bilat ankle DF will improve to 10 degrees in order for pt to descend stairs pain- free by 12/07/15.    Time 8   Period Weeks   Status New   PT LONG TERM GOAL #3   Title LEFS will improve by 10 points by 12/07/15.    Time 8   Period Weeks   Status New               Plan - 10/12/15 1557    Clinical Impression Statement Pt presents for mod complexity evaluation for bilat heel and foot pain. Signs and symptoms are compatible with bilat Achilles tendinitis. Pt presents with impairments including pain, impaired mobility/ROM, and impaired strength, which limit pt's functional abilities with walking, standing, stairs.  Pt will benefit from oupt PT for 3 times a week for 8 weeks in order to address these impairments and functional limitations and return pt to pain-free PLOF.    Pt will benefit from skilled therapeutic intervention in order to improve on the following deficits Abnormal gait;Decreased activity tolerance;Decreased balance;Decreased mobility;Decreased knowledge of use of DME;Decreased endurance;Decreased range of motion;Difficulty walking;Increased muscle spasms;Decreased strength;Improper body mechanics;Pain;Hypomobility;Impaired flexibility   Rehab Potential Good   PT Frequency 3x / week   PT Duration 8 weeks   PT Treatment/Interventions ADLs/Self Care Home Management;Moist Heat;Gait training;Stair training;Functional mobility training;Therapeutic activities;Therapeutic exercise;Balance training;Manual techniques;Neuromuscular re-education;Patient/family education;Dry needling;Taping;Passive range of  motion;Cryotherapy;Electrical Stimulation;Iontophoresis /ml Dexamethasone;Ultrasound   PT Next Visit Plan Have pt fill out LEFS. Review HEP: Ankle AROM.  DF stretch, towel swipes, toe towel crunches, heel slides, BAPS   PT Home Exercise Plan  Ankle AROM: DF/PF, Inv/Ev, circles, ABCs,    Consulted and Agree with Plan of Care Patient         Problem List Patient Active Problem List   Diagnosis Date Noted  . Chronic foot pain 08/26/2015  . Night sweats 02/04/2015  . Atypical chest pain 02/04/2015  . Myalgia 04/10/2014  . Headache(784.0) 04/03/2014  . Insomnia 04/03/2014  . Obesity 04/03/2014  . Hyperlipidemia 03/03/2014  . Arthritis 03/03/2014  . Seasonal allergies 03/03/2014    Haze Rushing, PT 10/12/2015, 4:19 PM  [facility] [address] [place], [place], [address] Phone: [phone]   Fax:  [phone]  Name: [name] MRN: [id number] Date of Birth: [birth year]

## 2015-10-12 NOTE — Patient Instructions (Signed)
ROM: Inversion / Eversion   With left leg relaxed, gently turn ankle and foot in and out. Move through full range of motion. Avoid pain. Repeat __10__ times per set. Do __2__ sets per session. Do __3__ sessions per day.  http://orth.exer.us/36   Copyright  VHI. All rights reserved.  ROM: Plantar / Dorsiflexion   With left leg relaxed, gently flex and extend ankle. Move through full range of motion. Avoid pain. Repeat __10__ times per set. Do _2_ sets per session. Do _3___ sessions per day.  http://orth.exer.us/34   Copyright  VHI. All rights reserved.  Ankle Alphabet   Using left ankle and foot only, trace the letters of the alphabet. Perform A to Z. Repeat __1__ times per set. Do __2__ sets per session. Do _3___ sessions per day.  http://orth.exer.us/16   Copyright  VHI. All rights reserved.  Ankle Circles   Slowly rotate right foot and ankle clockwise then counterclockwise. Gradually increase range of motion. Avoid pain. Circle __10__ times each direction per set. Do __2__ sets per session. Do __3__ sessions per day.  http://orth.exer.us/30   Copyright  VHI. All rights reserved.

## 2015-10-25 ENCOUNTER — Ambulatory Visit

## 2015-10-25 DIAGNOSIS — M79671 Pain in right foot: Secondary | ICD-10-CM

## 2015-10-25 DIAGNOSIS — M7661 Achilles tendinitis, right leg: Secondary | ICD-10-CM | POA: Diagnosis not present

## 2015-10-25 DIAGNOSIS — Z7409 Other reduced mobility: Secondary | ICD-10-CM

## 2015-10-25 DIAGNOSIS — M79672 Pain in left foot: Secondary | ICD-10-CM

## 2015-10-25 DIAGNOSIS — Z789 Other specified health status: Secondary | ICD-10-CM

## 2015-10-25 DIAGNOSIS — R262 Difficulty in walking, not elsewhere classified: Secondary | ICD-10-CM

## 2015-10-25 DIAGNOSIS — M7662 Achilles tendinitis, left leg: Secondary | ICD-10-CM

## 2015-10-25 DIAGNOSIS — R531 Weakness: Secondary | ICD-10-CM

## 2015-10-25 NOTE — Therapy (Signed)
[facility] [address] [place], [place], [address] Phone: [phone]   Fax:  [phone]  Physical Therapy Treatment  Patient Details  Name: [name] MRN: [id number] Date of Birth: 21-Mar-1966 Referring Provider: Dr Elodia Florence  Encounter Date: 10/25/2015      PT End of Session - 10/25/15 1553    Visit Number 2   Number of Visits 24   Date for PT Re-Evaluation 12/07/15   PT Start Time 1500   PT Stop Time 1600   PT Time Calculation (min) 60 min   Activity Tolerance Patient tolerated treatment well   Behavior During Therapy [facility] for tasks assessed/performed      Past Medical History  Diagnosis Date  . Arthritis   . Hyperlipidemia 03/03/2014    Past Surgical History  Procedure Laterality Date  . Cholecystectomy    . Breast surgery      nodule in breast removed-not cancer  . Abdominal hysterectomy      partial-still has ovaries. Benign reason. Unsure about cervix   . Neuroplasty / transposition median nerve at carpal tunnel bilateral Bilateral     There were no vitals filed for this visit.  Visit Diagnosis:  Achilles tendinitis of both lower extremities  Difficulty in walking  Weakness  Heel pain, bilateral  Impaired mobility and ADLs      Subjective Assessment - 10/25/15 1512    Subjective Pt saw MD on Thursday. Office received fax for [facility] script. Pain is the same. Pt reports compliance with HEP. 8/10 pain today    Currently in Pain? Yes   Pain Score 8    Pain Location Heel   Pain Orientation Right;Left   Pain Descriptors / Indicators Throbbing;Sharp   Pain Type Chronic pain   Pain Onset More than a month ago                         [facility] Adult PT Treatment/Exercise - 10/25/15 0001    Self-Care   Self-Care RICE;Heat/Ice Application;Other Self-Care Comments   Other Self-Care Comments  dispensed and educated on use of cane.    Modalities   Modalities Cryotherapy;Iontophoresis    Cryotherapy   Number Minutes Cryotherapy 10 Minutes   Cryotherapy Location Ankle   Type of Cryotherapy Ice pack   Iontophoresis   Type of Iontophoresis Dexamethasone   Location L heel    Dose 4 ml, 1 cc    Time 6 hour patch    Ankle Exercises: Stretches   Gastroc Stretch 3 reps;30 seconds  bilat    Ankle Exercises: Seated   ABC's 1 rep  HEP   Ankle Circles/Pumps 10 reps  HEP   Towel Crunch 5 reps   Heel Raises 10 reps   Toe Raise 10 reps   Heel Slides 10 reps   Other Seated Ankle Exercises DF/PF, Inv/Ev, 10 x each   HEP                PT Education - 10/25/15 1551    Education provided Yes   Education Details dispensed and use of SPC    Person(s) Educated Patient   Methods Explanation   Comprehension Verbalized understanding          PT Short Term Goals - 10/25/15 1735    PT SHORT TERM GOAL #1   Title Pt will be I with initial HEP for continued strengthening and mobility by 11/12/15.   Time 4   Period Weeks  Status On-going   PT SHORT TERM GOAL #2   Title Bilat ankle DF AROM will improve to 0 degrees pain-free in order to stand for 5 mins without pain 11/22/15.    Time 4   Period Weeks   Status On-going   PT SHORT TERM GOAL #3   Title Tenderness at bilat heels will diminish from severe to minimal in order to tolerate palpation and pressure to posterior heel by 11/12/15.    Time 4   Period Weeks   Status On-going           PT Long Term Goals - 10/25/15 1736    PT LONG TERM GOAL #1   Title Bilat  ankle PF strength will improve to 3+/5 in order to ascend stairs and walk for 15 mins pain free by 12/07/15  .    Time 8   Status On-going   PT LONG TERM GOAL #2   Title Bilat ankle DF will improve to 10 degrees in order for pt to descend stairs pain- free by 12/07/15.    Time 8   Period Weeks   Status On-going   PT LONG TERM GOAL #3   Title LEFS will improve by 10 points by 12/07/15.    Time 8   Period Weeks   Status On-going                Plan - 10/25/15 1733    Clinical Impression Statement Pt unable to tolerate MT to region, minimal AROM to bilat ankles. Added stretches of gastroc with strap with some tolerance. Added ionto patch to L heel and educated pt on purpase and to remove patch in 4-6 hours or if uncomfortable. Pt verbalized understanding.  LEFS filled out and pt scored 6/80   PT Next Visit Plan Review HEP: Ankle AROM.  DF stretch,add soleus stretch, towel swipes: inv/ev, toe towel crunches, heel slides, BAPS   PT Home Exercise Plan  Ankle AROM: DF/PF, Inv/Ev, circles, ABCs,    Consulted and Agree with Plan of Care Patient        Problem List Patient Active Problem List   Diagnosis Date Noted  . Chronic foot pain 08/26/2015  . Night sweats 02/04/2015  . Atypical chest pain 02/04/2015  . Myalgia 04/10/2014  . Headache(784.0) 04/03/2014  . Insomnia 04/03/2014  . Obesity 04/03/2014  . Hyperlipidemia 03/03/2014  . Arthritis 03/03/2014  . Seasonal allergies 03/03/2014    Haze RushingJessica Tarrance Januszewski, PT 10/25/2015, 5:39 PM  [facility] [address] [place], [place], [address] Phone: [phone]   Fax:  [phone]  Name: [name] MRN: [id number] Date of Birth: 1966/04/28

## 2015-10-28 ENCOUNTER — Ambulatory Visit

## 2015-10-29 ENCOUNTER — Ambulatory Visit: Admitting: Physical Therapy

## 2015-10-29 DIAGNOSIS — M79672 Pain in left foot: Secondary | ICD-10-CM

## 2015-10-29 DIAGNOSIS — M7661 Achilles tendinitis, right leg: Secondary | ICD-10-CM | POA: Diagnosis not present

## 2015-10-29 DIAGNOSIS — M7662 Achilles tendinitis, left leg: Secondary | ICD-10-CM

## 2015-10-29 DIAGNOSIS — M79671 Pain in right foot: Secondary | ICD-10-CM

## 2015-10-29 DIAGNOSIS — R531 Weakness: Secondary | ICD-10-CM

## 2015-10-29 DIAGNOSIS — Z7409 Other reduced mobility: Secondary | ICD-10-CM

## 2015-10-29 DIAGNOSIS — Z789 Other specified health status: Secondary | ICD-10-CM

## 2015-10-29 DIAGNOSIS — R262 Difficulty in walking, not elsewhere classified: Secondary | ICD-10-CM

## 2015-10-29 NOTE — Therapy (Signed)
[facility] [address] [place], [place], [address] Phone: [phone]   Fax:  [phone]  Physical Therapy Treatment  Patient Details  Name: [name] MRN: [id number] Date of Birth: Oct 11, 1965 Referring Provider: Dr Elodia Florence  Encounter Date: 10/29/2015      PT End of Session - 10/29/15 1033    Visit Number 3   Number of Visits 24   Date for PT Re-Evaluation 12/07/15   PT Start Time 1010   PT Stop Time 1059   PT Time Calculation (min) 49 min   Activity Tolerance Patient limited by pain   Behavior During Therapy [facility] for tasks assessed/performed      Past Medical History  Diagnosis Date  . Arthritis   . Hyperlipidemia 03/03/2014    Past Surgical History  Procedure Laterality Date  . Cholecystectomy    . Breast surgery      nodule in breast removed-not cancer  . Abdominal hysterectomy      partial-still has ovaries. Benign reason. Unsure about cervix   . Neuroplasty / transposition median nerve at carpal tunnel bilateral Bilateral     There were no vitals filed for this visit.  Visit Diagnosis:  Achilles tendinitis of both lower extremities  Difficulty in walking  Weakness  Heel pain, bilateral  Impaired mobility and ADLs      Subjective Assessment - 10/29/15 1009    Subjective Pt tearful, "tired of dealing with my feet".  Walks in with cane.    Currently in Pain? Yes   Pain Score 10-Worst pain ever   Pain Location Foot  heel and bottoms of feet   Pain Orientation Right;Left   Pain Descriptors / Indicators Throbbing;Sharp;Numbness   Pain Type Chronic pain   Pain Onset More than a month ago   Pain Frequency Constant   Aggravating Factors  walking    Pain Relieving Factors RICE   Multiple Pain Sites No             [facility] Adult PT Treatment/Exercise - 10/29/15 1015    Self-Care   Other Self-Care Comments  self mobilization of ankle and toes, again would not let PT touch her ankle/heel     Cryotherapy   Number Minutes Cryotherapy 10 Minutes   Cryotherapy Location Ankle   Type of Cryotherapy Ice pack   Iontophoresis   Type of Iontophoresis Dexamethasone   Location L heel    Dose 4 ml, 1 cc    Time 6 hour patch    Ankle Exercises: Supine   Other Supine Ankle Exercises DF, PF, INV and EV , used a sheet to facilitate DF, very limited    Ankle Exercises: Seated   Ankle Circles/Pumps 10 reps  HEP   Towel Inversion/Eversion 3 reps   Heel Raises 10 reps  seated   Toe Raise 10 reps  seated   Heel Slides 10 reps   Other Seated Ankle Exercises DF/PF, Inv/Ev, 10 x each   HEP                PT Education - 10/29/15 1100    Education provided Yes   Education Details [place] and ionto, ankle ROM    Person(s) Educated Patient   Methods Explanation   Comprehension Verbalized understanding;Returned demonstration;Need further instruction          PT Short Term Goals - 10/29/15 1106    PT SHORT TERM GOAL #1   Title Pt will be I with initial HEP for continued strengthening  and mobility by 11/12/15.   Status On-going   PT SHORT TERM GOAL #2   Title Bilat ankle DF AROM will improve to 0 degrees pain-free in order to stand for 5 mins without pain 11/22/15.    Status On-going   PT SHORT TERM GOAL #3   Title Tenderness at bilat heels will diminish from severe to minimal in order to tolerate palpation and pressure to posterior heel by 11/12/15.    Status On-going           PT Long Term Goals - 10/29/15 1106    PT LONG TERM GOAL #1   Title Bilat  ankle PF strength will improve to 3+/5 in order to ascend stairs and walk for 15 mins pain free by 12/07/15  .    Status On-going   PT LONG TERM GOAL #2   Title Bilat ankle DF will improve to 10 degrees in order for pt to descend stairs pain- free by 12/07/15.    Status On-going   PT LONG TERM GOAL #3   Title LEFS will improve by 10 points by 12/07/15.    Status On-going               Plan - 10/29/15 1102    Clinical  Impression Statement Pt with low tolerance for manual and AAROM. Encouraged her to self mobilize her ankle and metatarsals to re-educate nerves and stretch mm.  Trial of US today along with bilateral ankle ionto.  Pt 8/10 post PT prior to walking out the clinic.     PT Next Visit Plan Review HEP: Ankle AROM.  DF stretch,add soleus stretch, towel swipes: inv/ev, toe towel crunches, heel slides, BAPS, repeat modalities.     PT Home Exercise Plan  Ankle AROM: DF/PF, Inv/Ev, circles, ABCs,    Consulted and Agree with Plan of Care Patient        Problem List Patient Active Problem List   Diagnosis Date Noted  . Chronic foot pain 08/26/2015  . Night sweats 02/04/2015  . Atypical chest pain 02/04/2015  . Myalgia 04/10/2014  . Headache(784.0) 04/03/2014  . Insomnia 04/03/2014  . Obesity 04/03/2014  . Hyperlipidemia 03/03/2014  . Arthritis 03/03/2014  . Seasonal allergies 03/03/2014    [name] 10/29/2015, 11:09 AM  [facility] [address] [place], [place], [address] Phone: [phone]   Fax:  [phone]  Name: [name] MRN: [id number] Date of Birth: 1966-06-03   Haley MainlandJennifer Threasa Robertson, PT 10/29/2015 11:10 AM Phone: [phone] Fax: [phone]

## 2015-11-01 ENCOUNTER — Ambulatory Visit: Admitting: Physical Therapy

## 2015-11-01 ENCOUNTER — Encounter: Admitting: Physical Therapy

## 2015-11-03 ENCOUNTER — Ambulatory Visit

## 2015-11-03 DIAGNOSIS — M79672 Pain in left foot: Secondary | ICD-10-CM

## 2015-11-03 DIAGNOSIS — M79671 Pain in right foot: Secondary | ICD-10-CM

## 2015-11-03 DIAGNOSIS — Z7409 Other reduced mobility: Secondary | ICD-10-CM

## 2015-11-03 DIAGNOSIS — M7661 Achilles tendinitis, right leg: Secondary | ICD-10-CM

## 2015-11-03 DIAGNOSIS — M7662 Achilles tendinitis, left leg: Secondary | ICD-10-CM

## 2015-11-03 DIAGNOSIS — R262 Difficulty in walking, not elsewhere classified: Secondary | ICD-10-CM

## 2015-11-03 DIAGNOSIS — Z789 Other specified health status: Secondary | ICD-10-CM

## 2015-11-03 DIAGNOSIS — R531 Weakness: Secondary | ICD-10-CM

## 2015-11-03 NOTE — Therapy (Signed)
[facility] [address] [place], [place], [address] Phone: [phone]   Fax:  [phone]  Physical Therapy Treatment  Patient Details  Name: [name] MRN: [id number] Date of Birth: August 01, 1966 Referring Provider: Dr Elodia Florence  Encounter Date: 11/03/2015      PT End of Session - 11/03/15 1511    Visit Number 4   Number of Visits 24   Date for PT Re-Evaluation 12/07/15   PT Start Time 1500   PT Stop Time 1550   PT Time Calculation (min) 50 min   Activity Tolerance Patient limited by pain   Behavior During Therapy [facility] for tasks assessed/performed      Past Medical History  Diagnosis Date  . Arthritis   . Hyperlipidemia 03/03/2014    Past Surgical History  Procedure Laterality Date  . Cholecystectomy    . Breast surgery      nodule in breast removed-not cancer  . Abdominal hysterectomy      partial-still has ovaries. Benign reason. Unsure about cervix   . Neuroplasty / transposition median nerve at carpal tunnel bilateral Bilateral     There were no vitals filed for this visit.  Visit Diagnosis:  Difficulty in walking  Achilles tendinitis of both lower extremities  Weakness  Heel pain, bilateral  Impaired mobility and ADLs      Subjective Assessment - 11/03/15 1506    Subjective 8/10 today. No significant changes with therapy noted.    Currently in Pain? Yes   Pain Score 8    Pain Location Foot   Pain Orientation Right;Left   Pain Descriptors / Indicators Aching;Sharp   Pain Type Chronic pain   Pain Onset More than a month ago   Pain Frequency Constant                         [facility] Adult PT Treatment/Exercise - 11/03/15 0001    Ambulation/Gait   Gait Comments GT with RW- explained importance of being able to offload through joint. R foot pain decresaed with use of RW.    Self-Care   Self-Care --  recommended use of RW   Modalities   Modalities Ultrasound   Ultrasound    Ultrasound Location bilat heels    Ultrasound Parameters .8 W/Cm2, 1 MHz   5 mins each    Ultrasound Goals Edema;Pain;Other (Comment)  healing   Iontophoresis   Type of Iontophoresis Dexamethasone   Location Bilat  heel    Dose 4 ml, 1 cc    Time 6 hour patch    Manual Therapy   Manual therapy comments STM to bilat Calf , anterior tib, staying away from achilles tendon and heel.    Ankle Exercises: Seated   ABC's 1 rep  HEP   Ankle Circles/Pumps 10 reps  HEP   Heel Raises 10 reps  seated   Toe Raise 10 reps  seated   Other Seated Ankle Exercises DF/PF, Inv/Ev, 10 x each   HEP   Ankle Exercises: Stretches   Gastroc Stretch 3 reps;30 seconds  bilat                   PT Short Term Goals - 10/29/15 1106    PT SHORT TERM GOAL #1   Title Pt will be I with initial HEP for continued strengthening and mobility by 11/12/15.   Status On-going   PT SHORT TERM GOAL #2   Title Bilat ankle DF AROM  will improve to 0 degrees pain-free in order to stand for 5 mins without pain 11/22/15.    Status On-going   PT SHORT TERM GOAL #3   Title Tenderness at bilat heels will diminish from severe to minimal in order to tolerate palpation and pressure to posterior heel by 11/12/15.    Status On-going           PT Long Term Goals - 10/29/15 1106    PT LONG TERM GOAL #1   Title Bilat  ankle PF strength will improve to 3+/5 in order to ascend stairs and walk for 15 mins pain free by 12/07/15  .    Status On-going   PT LONG TERM GOAL #2   Title Bilat ankle DF will improve to 10 degrees in order for pt to descend stairs pain- free by 12/07/15.    Status On-going   PT LONG TERM GOAL #3   Title LEFS will improve by 10 points by 12/07/15.    Status On-going               Plan - 11/03/15 1839    Clinical Impression Statement Reviewed AROM HEP and stretches. Performed US to bilat heels and ionto applied to bilat heel again as well. Pt tolerated gentle STM to calf and anterior tib, but  did not feel relief from STM. Returns to MD 11/11/15 and pt will ask for imaging.    PT Next Visit Plan Assess for changes with use of US and ionto . Add to HEP, B[place], [place], if tolerated.    PT Home Exercise Plan  Ankle AROM: DF/PF, Inv/Ev, circles, ABCs,    Consulted and Agree with Plan of Care Patient        Problem List Patient Active Problem List   Diagnosis Date Noted  . Chronic foot pain 08/26/2015  . Night sweats 02/04/2015  . Atypical chest pain 02/04/2015  . Myalgia 04/10/2014  . Headache(784.0) 04/03/2014  . Insomnia 04/03/2014  . Obesity 04/03/2014  . Hyperlipidemia 03/03/2014  . Arthritis 03/03/2014  . Seasonal allergies 03/03/2014    Haze RushingJessica Nikeshia Robertson , PT  11/03/2015, 6:43 PM  [facility] [address] [place], [place], [address] Phone: [phone]   Fax:  [phone]  Name: Haley PeersSolange Peloso MRN: [id number] Date of Birth: September 24, 1965

## 2015-11-05 ENCOUNTER — Ambulatory Visit

## 2015-11-05 DIAGNOSIS — M7661 Achilles tendinitis, right leg: Secondary | ICD-10-CM | POA: Diagnosis not present

## 2015-11-05 DIAGNOSIS — M6281 Muscle weakness (generalized): Secondary | ICD-10-CM

## 2015-11-05 DIAGNOSIS — R262 Difficulty in walking, not elsewhere classified: Secondary | ICD-10-CM

## 2015-11-05 NOTE — Therapy (Addendum)
[place], [place], [address] Phone: [phone]   Fax:  [phone]  Physical Therapy Treatment/Discharge  Patient Details  Name: [name] MRN: [id number] Date of Birth: Jul 17, 1966 Referring Provider: Dr Marlaine Hind  Encounter Date: 11/05/2015      PT End of Session - 11/05/15 1210    Visit Number 5   Number of Visits 24   Date for PT Re-Evaluation 12/07/15   PT Start Time 1105   PT Stop Time 1150   PT Time Calculation (min) 45 min   Activity Tolerance Patient limited by fatigue   Behavior During Therapy [facility] for tasks assessed/performed      Past Medical History  Diagnosis Date  . Arthritis   . Hyperlipidemia 03/03/2014    Past Surgical History  Procedure Laterality Date  . Cholecystectomy    . Breast surgery      nodule in breast removed-not cancer  . Abdominal hysterectomy      partial-still has ovaries. Benign reason. Unsure about cervix   . Neuroplasty / transposition median nerve at carpal tunnel bilateral Bilateral     There were no vitals filed for this visit.  Visit Diagnosis:  Difficulty in walking  Achilles tendinitis of both lower extremities  Weakness  Heel pain, bilateral      Subjective Assessment - 11/05/15 1113    Subjective No chang sinc start of Pt or since last session. She said if patch no better would stop   Currently in Pain? Yes   Pain Score 8    Pain Location Heel   Pain Orientation Right;Left   Pain Descriptors / Indicators Aching   Pain Type Chronic pain   Pain Onset More than a month ago   Pain Frequency Constant   Aggravating Factors  walking   Multiple Pain Sites No                         [facility] Adult PT Treatment/Exercise - 11/05/15 0001    Modalities   Modalities Moist Heat   Moist Heat Therapy   Number Minutes Moist Heat 12 Minutes   Moist Heat Location --  calves RT and LT   Ultrasound   Ultrasound Location  bilateral heel    Ultrasound Parameters .8Wcms 100% 1MHZ   Ultrasound Goals Edema;Pain;Other (Comment)   Manual Therapy   Manual Therapy Passive ROM   Manual therapy comments STM to bilat Calf , anterior tib, including  from achilles tendon and heel. loightly    Passive ROM passive DF RT and LT 60 sec and  with STW with rock tool                  PT Short Term Goals - 11/05/15 1232    PT SHORT TERM GOAL #1   Title Pt will be I with initial HEP for continued strengthening and mobility by 11/12/15.   Status On-going   PT SHORT TERM GOAL #2   Status On-going   PT SHORT TERM GOAL #3   Title Tenderness at bilat heels will diminish from severe to minimal in order to tolerate palpation and pressure to posterior heel by 11/12/15.    Status On-going           PT Long Term Goals - 10/29/15 1106    PT LONG TERM GOAL #1   Title Bilat  ankle PF strength will improve to 3+/5 in order to ascend stairs and walk for  15 mins pain free by 12/07/15  .    Status On-going   PT LONG TERM GOAL #2   Title Bilat ankle DF will improve to 10 degrees in order for pt to descend stairs pain- free by 12/07/15.    Status On-going   PT LONG TERM GOAL #3   Title LEFS will improve by 10 points by 12/07/15.    Status On-going               Plan - 11/05/15 1210    Clinical Impression Statement She felt heat was helpful but pain continues at high level She is obviuosly frustrated with continued pain and minimal to no changes. She is reluctant to use RW and reports soreness of RT wrist with using can . She feels CAM walker is making RT heel more painful   PT Next Visit Plan Discussed lack of progress and we can continue PT for some more sessions or can wait to talk to MD next seek. Will add some to HEP if returns before MD visit   Consulted and Agree with Plan of Care Patient        Problem List Patient Active Problem List   Diagnosis Date Noted  . Chronic foot pain 08/26/2015  . Night sweats  02/04/2015  . Atypical chest pain 02/04/2015  . Myalgia 04/10/2014  . Headache(784.0) 04/03/2014  . Insomnia 04/03/2014  . Obesity 04/03/2014  . Hyperlipidemia 03/03/2014  . Arthritis 03/03/2014  . Seasonal allergies 03/03/2014    Darrel Hoover PT 11/05/2015, 12:35 PM  [facility] [address] [place], [place], [address] Phone: [phone]   Fax:  [phone]  Name: [name] MRN: [id number] Date of Birth: 1966/04/01  PHYSICAL THERAPY DISCHARGE SUMMARY  Visits from Start of Care: 5  Current functional level related to goals / functional outcomes: See above She did nt return   Remaining deficits: Unknown   Education / Equipment: HEP Plan: Patient agrees to discharge.  Patient goals were not met. Patient is being discharged due to not returning since the last visit.  ?????    Lillette Boxer Eyad Rochford  PT  04/04/16

## 2015-11-08 ENCOUNTER — Telehealth: Payer: Self-pay | Admitting: Family Medicine

## 2015-11-08 NOTE — Telephone Encounter (Signed)
Pt call to ask for a referral to [facility]  [name]    She is going for a second opinion about her feet    [phone]

## 2015-11-08 NOTE — Telephone Encounter (Signed)
Yes thanks 

## 2015-11-08 NOTE — Telephone Encounter (Signed)
Ok to refer.

## 2015-11-09 ENCOUNTER — Other Ambulatory Visit: Payer: Self-pay | Admitting: Family Medicine

## 2015-11-09 DIAGNOSIS — M79673 Pain in unspecified foot: Secondary | ICD-10-CM

## 2015-11-09 NOTE — Telephone Encounter (Signed)
Referral placed.

## 2015-11-16 ENCOUNTER — Encounter: Admitting: Physical Therapy

## 2015-11-23 ENCOUNTER — Encounter: Admitting: Physical Therapy

## 2015-11-25 ENCOUNTER — Encounter: Admitting: Physical Therapy

## 2015-11-30 ENCOUNTER — Encounter: Admitting: Physical Therapy

## 2015-11-30 ENCOUNTER — Ambulatory Visit

## 2015-12-01 ENCOUNTER — Encounter

## 2015-12-01 ENCOUNTER — Encounter: Admitting: Physical Therapy

## 2016-01-11 ENCOUNTER — Encounter: Payer: Self-pay | Admitting: Family Medicine

## 2016-01-11 ENCOUNTER — Ambulatory Visit (INDEPENDENT_AMBULATORY_CARE_PROVIDER_SITE_OTHER): Admitting: Family Medicine

## 2016-01-11 VITALS — BP 122/88 | HR 80 | Temp 98.4°F | Ht 63.0 in | Wt 211.0 lb

## 2016-01-11 DIAGNOSIS — L659 Nonscarring hair loss, unspecified: Secondary | ICD-10-CM | POA: Diagnosis not present

## 2016-01-11 DIAGNOSIS — R21 Rash and other nonspecific skin eruption: Secondary | ICD-10-CM

## 2016-01-11 MED ORDER — TRIAMCINOLONE ACETONIDE 0.1 % EX CREA: 1.0000 "application " | TOPICAL_CREAM | Freq: Two times a day (BID) | CUTANEOUS | Status: DC

## 2016-01-11 NOTE — Patient Instructions (Signed)
We will call you within a week about your referral to [facility]. If you do not hear within 2 weeks, give us a call.   Rash on back almost has eczema appearance- dry and slightly red.  Treat with triamcinolone twice a day. Cover this with either vaseline, aquaphor, cetaphil to lock in the steroid. After 7 days take at least a 2-3 week break before trialing again (only trial again if found effective). Can refer to dermatology if this does not help. We discussed risk of lightening skin tone and to stop if you notice this

## 2016-01-11 NOTE — Progress Notes (Signed)
Subjective:  Elyn PeersSolange Veracruz is a 50 y.o. year old very pleasant female patient who presents for/with See problem oriented charting ROS- see any ROS included in HPI as well.   Past Medical History-  Patient Active Problem List   Diagnosis Date Noted  . Chronic foot pain 08/26/2015    Priority: Medium  . Myalgia 04/10/2014    Priority: Medium  . Hyperlipidemia 03/03/2014    Priority: Medium  . Headache(784.0) 04/03/2014    Priority: Low  . Insomnia 04/03/2014    Priority: Low  . Obesity 04/03/2014    Priority: Low  . Arthritis 03/03/2014    Priority: Low  . Seasonal allergies 03/03/2014    Priority: Low  . Night sweats 02/04/2015  . Atypical chest pain 02/04/2015    Medications- reviewed and updated Current Outpatient Prescriptions  Medication Sig Dispense Refill  . calcium carbonate (OS-CAL) 600 MG TABS tablet Take 600 mg by mouth at bedtime.     . cholecalciferol (VITAMIN D) 1000 UNITS tablet Take 2,000 Units by mouth daily.     . diclofenac sodium (VOLTAREN) 1 % GEL Place onto the skin.    Marland Kitchen. ibuprofen (ADVIL,MOTRIN) 400 MG tablet 400 mg.    . montelukast (SINGULAIR) 10 MG tablet Take 1 tablet (10 mg total) by mouth at bedtime. 30 tablet 11  . triamcinolone cream (KENALOG) 0.1 % Apply 1 application topically 2 (two) times daily. For 1 week max to affected area on back 80 g 0   No current facility-administered medications for this visit.    Objective: BP 122/88 mmHg  Pulse 80  Temp(Src) 98.4 F (36.9 C) (Oral)  Ht 5\' 3"  (1.6 m)  Wt 211 lb (95.709 kg)  BMI 37.39 kg/m2 Gen: NAD, resting comfortably CV: RRR no murmurs rubs or gallops Lungs: CTAB no crackles, wheeze, rhonchi Ext: no edema Skin: warm, dry On right upper back there is some area of dry skin with very mild underlying erythema. Some red macules on bilateral sides of upper back.   Takes her wig off and has large area of hair loss without obvious scarring in central area of scalp at least in 7 x 7 cm area.    Assessment/Plan:  Rash S: Itching on back for 2 months on upper right back. Seems to be more dry and at times has some small red bumps Lotion at night at times seems to help some. Scratches with back scratcher given the pruritic nature.  Has not changed detergents, uses dial soap- no recent change. Stable in course but tends to get worse then get better at times. Only medication taking is ibuprofen and voltaren gel for legs (needing upcoming surgery for torn achilles) ROS-not ill appearing, no fever/chills. No new medications. Not immunocompromised. No mucus membrane involvement.  A/P:From ZOX:WRUEAVS:Rash on back almost has eczema appearance- dry and slightly red.  Treat with triamcinolone twice a day. Cover this with either vaseline, aquaphor, cetaphil to lock in the steroid. After 7 days take at least a 2-3 week break before trialing again (only trial again if found effective). Can refer to dermatology if this does not help. We discussed risk of lightening skin tone and to stop if you notice this  Hair loss S: 2 years ago and has itching in scalp. Seems to be slowly worsening. States had been too embarrassed to bring it up. Denies pain. Has had normal TSH in this time frame.  A/P: Large area of hair loss on top of scalp- will refer to Dr. Darreld McleanMcMichael  at [facility] for evaluation and treatment.  Return precautions advised.   Orders Placed This Encounter  Procedures  . Ambulatory referral to Dermatology    Referral Priority:  Routine    Referral Type:  Consultation    Referral Reason:  Specialty Services Required    Requested Specialty:  Dermatology    Number of Visits Requested:  1    Meds ordered this encounter  Medications  . ibuprofen (ADVIL,MOTRIN) 400 MG tablet    Sig: 400 mg.  . diclofenac sodium (VOLTAREN) 1 % GEL    Sig: Place onto the skin.  Marland Kitchen triamcinolone cream (KENALOG) 0.1 %    Sig: Apply 1 application topically 2 (two) times daily. For 1 week max to affected area on back     Dispense:  80 g    Refill:  0  2 new acute issues with medical management for 1.   Tana Conch, MD

## 2016-04-06 LAB — HM MAMMOGRAPHY

## 2016-04-07 ENCOUNTER — Encounter: Payer: Self-pay | Admitting: Family Medicine

## 2016-04-19 ENCOUNTER — Other Ambulatory Visit: Payer: Self-pay | Admitting: Family Medicine

## 2016-04-20 NOTE — Telephone Encounter (Signed)
Yes thanks, may fill- should still be 1 week max at any time due to risk of lightening skin.

## 2016-08-07 HISTORY — PX: COLONOSCOPY: SHX174

## 2016-10-19 ENCOUNTER — Ambulatory Visit: Admitting: Family Medicine

## 2016-11-30 ENCOUNTER — Ambulatory Visit: Admitting: Family Medicine

## 2016-12-12 LAB — HM COLONOSCOPY

## 2016-12-14 ENCOUNTER — Encounter: Payer: Self-pay | Admitting: Family Medicine

## 2017-01-15 LAB — CBC AND DIFFERENTIAL
HEMATOCRIT: 36 (ref 36–46)
HEMOGLOBIN: 11.9 — AB (ref 12.0–16.0)
Neutrophils Absolute: 3
PLATELETS: 267 (ref 150–399)
WBC: 5.8

## 2017-01-15 LAB — HEPATIC FUNCTION PANEL
ALK PHOS: 48 (ref 25–125)
ALT: 16 (ref 7–35)
AST: 16 (ref 13–35)
Bilirubin, Total: 0.5

## 2017-01-15 LAB — BASIC METABOLIC PANEL
BUN: 18 (ref 4–21)
Creatinine: 0.7 (ref 0.5–1.1)
Glucose: 89
Potassium: 3.8 (ref 3.4–5.3)
Sodium: 141 (ref 137–147)

## 2017-01-18 ENCOUNTER — Encounter: Payer: Self-pay | Admitting: Family Medicine

## 2017-07-19 ENCOUNTER — Other Ambulatory Visit: Payer: Self-pay | Admitting: Anesthesiology

## 2017-07-24 ENCOUNTER — Encounter (HOSPITAL_COMMUNITY)
Admission: RE | Admit: 2017-07-24 | Discharge: 2017-07-24 | Disposition: A | Discharge status: Home / Self Care | Source: Ambulatory Visit | Attending: Anesthesiology | Admitting: Anesthesiology

## 2017-07-24 ENCOUNTER — Other Ambulatory Visit: Payer: Self-pay

## 2017-07-24 ENCOUNTER — Encounter (HOSPITAL_COMMUNITY): Payer: Self-pay

## 2017-07-24 DIAGNOSIS — M5416 Radiculopathy, lumbar region: Secondary | ICD-10-CM | POA: Diagnosis not present

## 2017-07-24 DIAGNOSIS — M545 Low back pain: Secondary | ICD-10-CM | POA: Diagnosis not present

## 2017-07-24 DIAGNOSIS — E669 Obesity, unspecified: Secondary | ICD-10-CM | POA: Diagnosis not present

## 2017-07-24 DIAGNOSIS — Z79899 Other long term (current) drug therapy: Secondary | ICD-10-CM | POA: Diagnosis not present

## 2017-07-24 DIAGNOSIS — G894 Chronic pain syndrome: Secondary | ICD-10-CM | POA: Diagnosis present

## 2017-07-24 HISTORY — DX: Gastro-esophageal reflux disease without esophagitis: K21.9

## 2017-07-24 HISTORY — DX: Major depressive disorder, single episode, unspecified: F32.9

## 2017-07-24 HISTORY — DX: Depression, unspecified: F32.A

## 2017-07-24 LAB — PROTIME-INR
INR: 1.08
Prothrombin Time: 13.9 seconds (ref 11.4–15.2)

## 2017-07-24 LAB — BASIC METABOLIC PANEL
Anion gap: 7 (ref 5–15)
BUN: 19 mg/dL (ref 6–20)
CO2: 25 mmol/L (ref 22–32)
Calcium: 10.5 mg/dL — ABNORMAL HIGH (ref 8.9–10.3)
Chloride: 104 mmol/L (ref 101–111)
Creatinine, Ser: 0.83 mg/dL (ref 0.44–1.00)
GFR calc Af Amer: >60 mL/min (ref >60)
GFR calc non Af Amer: >60 mL/min (ref >60)
Glucose, Bld: 89 mg/dL (ref 65–99)
Potassium: 3.7 mmol/L (ref 3.5–5.1)
Sodium: 136 mmol/L (ref 135–145)

## 2017-07-24 LAB — APTT: aPTT: 29 seconds (ref 24–36)

## 2017-07-24 LAB — CBC
HCT: 38.2 % (ref 36.0–46.0)
Hemoglobin: 12.7 g/dL (ref 12.0–15.0)
MCH: 28.6 pg (ref 26.0–34.0)
MCHC: 33.2 g/dL (ref 30.0–36.0)
MCV: 86 fL (ref 78.0–100.0)
Platelets: 225 10*3/uL (ref 150–400)
RBC: 4.44 MIL/uL (ref 3.87–5.11)
RDW: 13.8 % (ref 11.5–15.5)
WBC: 4.2 10*3/uL (ref 4.0–10.5)

## 2017-07-24 LAB — SURGICAL PCR SCREEN
MRSA, PCR: NEGATIVE
Staphylococcus aureus: NEGATIVE

## 2017-07-24 NOTE — Pre-Procedure Instructions (Signed)
Brantley Garduno  07/24/2017      [id number] [place], [place] - [address] [place] [address] Phone: [phone] Fax: [phone]    Your procedure is scheduled on Thursday 07/26/17  [facility] Admitting at 530 A.M.  Call this number if you have problems the morning of surgery:  [phone]   Remember:  Do not eat food or drink liquids after midnight.  Take these medicines the morning of surgery with A SIP OF WATER  SINGULAIR, LYRICA, OXYCODONE IF NEEDED  7 days prior to surgery STOP taking any Aspirin(unless otherwise instructed by your surgeon), Aleve, Naproxen, Ibuprofen, Motrin, Advil, Goody's, BC's, all herbal medications, fish oil, and all vitamins   Do not wear jewelry, make-up or nail polish.  Do not wear lotions, powders, or perfumes, or deodorant.  Do not shave 48 hours prior to surgery.  Men may shave face and neck.  Do not bring valuables to the hospital.  [facility] is not responsible for any belongings or valuables.  Contacts, dentures or bridgework may not be worn into surgery.  Leave your suitcase in the car.  After surgery it may be brought to your room.  For patients admitted to the hospital, discharge time will be determined by your treatment team.  Patients discharged the day of surgery will not be allowed to drive home.   Name and phone number of your driver:    Special instructions:  [facility] - Preparing for Surgery  Before surgery, you can play an important role.  Because skin is not sterile, your skin needs to be as free of germs as possible.  You can reduce the number of germs on you skin by washing with CHG (chlorahexidine gluconate) soap before surgery.  CHG is an antiseptic cleaner which kills germs and bonds with the skin to continue killing germs even after washing.  Please DO NOT use if you have an allergy to CHG or antibacterial soaps.  If your skin becomes  reddened/irritated stop using the CHG and inform your nurse when you arrive at Short Stay.  Do not shave (including legs and underarms) for at least 48 hours prior to the first CHG shower.  You may shave your face.  Please follow these instructions carefully:   1.  Shower with CHG Soap the night before surgery and the                                morning of Surgery.  2.  If you choose to wash your hair, wash your hair first as usual with your       normal shampoo.  3.  After you shampoo, rinse your hair and body thoroughly to remove the                      Shampoo.  4.  Use CHG as you would any other liquid soap.  You can apply chg directly       to the skin and wash gently with scrungie or a clean washcloth.  5.  Apply the CHG Soap to your body ONLY FROM THE NECK DOWN.        Do not use on open wounds or open sores.  Avoid contact with your eyes,       ears, mouth and genitals (private parts).  Wash genitals (private parts)  with your normal soap.  6.  Wash thoroughly, paying special attention to the area where your surgery        will be performed.  7.  Thoroughly rinse your body with warm water from the neck down.  8.  DO NOT shower/wash with your normal soap after using and rinsing off       the CHG Soap.  9.  Pat yourself dry with a clean towel.            10.  Wear clean pajamas.            11.  Place clean sheets on your bed the night of your first shower and do not        sleep with pets.  Day of Surgery  Do not apply any lotions/deoderants the morning of surgery.  Please wear clean clothes to the hospital/surgery center.    Please read over the following fact sheets that you were given. MRSA Information and Surgical Site Infection Prevention
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
## 2017-07-26 ENCOUNTER — Ambulatory Visit (HOSPITAL_COMMUNITY)

## 2017-07-26 ENCOUNTER — Encounter (HOSPITAL_COMMUNITY)
Admission: RE | Disposition: A | Discharge status: Home / Self Care | Payer: Self-pay | Source: Ambulatory Visit | Attending: Anesthesiology

## 2017-07-26 ENCOUNTER — Encounter (HOSPITAL_COMMUNITY): Payer: Self-pay | Admitting: *Deleted

## 2017-07-26 ENCOUNTER — Ambulatory Visit (HOSPITAL_COMMUNITY): Admitting: Certified Registered Nurse Anesthetist

## 2017-07-26 ENCOUNTER — Ambulatory Visit (HOSPITAL_COMMUNITY)
Admission: RE | Admit: 2017-07-26 | Discharge: 2017-07-26 | Disposition: A | Discharge status: Home / Self Care | Source: Ambulatory Visit | Attending: Anesthesiology | Admitting: Anesthesiology

## 2017-07-26 DIAGNOSIS — G894 Chronic pain syndrome: Secondary | ICD-10-CM | POA: Diagnosis not present

## 2017-07-26 DIAGNOSIS — E669 Obesity, unspecified: Secondary | ICD-10-CM | POA: Insufficient documentation

## 2017-07-26 DIAGNOSIS — Z419 Encounter for procedure for purposes other than remedying health state, unspecified: Secondary | ICD-10-CM

## 2017-07-26 DIAGNOSIS — M545 Low back pain: Secondary | ICD-10-CM | POA: Insufficient documentation

## 2017-07-26 DIAGNOSIS — Z79899 Other long term (current) drug therapy: Secondary | ICD-10-CM | POA: Insufficient documentation

## 2017-07-26 DIAGNOSIS — M5416 Radiculopathy, lumbar region: Secondary | ICD-10-CM | POA: Insufficient documentation

## 2017-07-26 HISTORY — PX: SPINAL CORD STIMULATOR INSERTION: SHX5378

## 2017-07-26 SURGERY — INSERTION, SPINAL CORD STIMULATOR, LUMBAR
Anesthesia: General | Site: Back

## 2017-07-26 MED ORDER — CEFAZOLIN SODIUM-DEXTROSE 2-4 GM/100ML-% IV SOLN
INTRAVENOUS | Status: AC
  Filled 2017-07-26: qty 100

## 2017-07-26 MED ORDER — CHLORHEXIDINE GLUCONATE CLOTH 2 % EX PADS: 6.0000 | MEDICATED_PAD | Freq: Once | CUTANEOUS | Status: DC

## 2017-07-26 MED ORDER — DEXAMETHASONE SODIUM PHOSPHATE 10 MG/ML IJ SOLN
INTRAMUSCULAR | Status: DC | PRN
  Administered 2017-07-26: 10 mg via INTRAVENOUS

## 2017-07-26 MED ORDER — BACITRACIN-NEOMYCIN-POLYMYXIN 400-5-5000 EX OINT
TOPICAL_OINTMENT | CUTANEOUS | Status: AC
  Filled 2017-07-26: qty 1

## 2017-07-26 MED ORDER — ONDANSETRON HCL 4 MG/2ML IJ SOLN
INTRAMUSCULAR | Status: DC | PRN
Start: 2017-07-26 — End: 2017-07-26
  Administered 2017-07-26: 4 mg via INTRAVENOUS

## 2017-07-26 MED ORDER — ONDANSETRON HCL 4 MG/2ML IJ SOLN
INTRAMUSCULAR | Status: AC
  Filled 2017-07-26: qty 2

## 2017-07-26 MED ORDER — IBUPROFEN 100 MG/5ML PO SUSP: 200.0000 mg | Freq: Four times a day (QID) | ORAL | Status: DC | PRN

## 2017-07-26 MED ORDER — MIDAZOLAM HCL 2 MG/2ML IJ SOLN
INTRAMUSCULAR | Status: AC
  Filled 2017-07-26: qty 2

## 2017-07-26 MED ORDER — OXYCODONE-ACETAMINOPHEN 10-325 MG PO TABS: 1.0000 | ORAL_TABLET | ORAL | 0 refills | Status: AC | PRN

## 2017-07-26 MED ORDER — MIDAZOLAM HCL 5 MG/5ML IJ SOLN
INTRAMUSCULAR | Status: DC | PRN
Start: 2017-07-26 — End: 2017-07-26
  Administered 2017-07-26: 2 mg via INTRAVENOUS

## 2017-07-26 MED ORDER — FENTANYL CITRATE (PF) 100 MCG/2ML IJ SOLN
INTRAMUSCULAR | Status: DC | PRN
  Administered 2017-07-26: 50 ug via INTRAVENOUS
  Administered 2017-07-26: 100 ug via INTRAVENOUS

## 2017-07-26 MED ORDER — CEFAZOLIN SODIUM-DEXTROSE 2-4 GM/100ML-% IV SOLN
2.0000 g | INTRAVENOUS | Status: AC
  Administered 2017-07-26: 2 g via INTRAVENOUS

## 2017-07-26 MED ORDER — ROCURONIUM BROMIDE 10 MG/ML (PF) SYRINGE
PREFILLED_SYRINGE | INTRAVENOUS | Status: DC | PRN
  Administered 2017-07-26: 40 mg via INTRAVENOUS
  Administered 2017-07-26: 20 mg via INTRAVENOUS

## 2017-07-26 MED ORDER — IBUPROFEN 200 MG PO TABS: 200.0000 mg | ORAL_TABLET | Freq: Four times a day (QID) | ORAL | Status: DC | PRN

## 2017-07-26 MED ORDER — SUGAMMADEX SODIUM 200 MG/2ML IV SOLN
INTRAVENOUS | Status: DC | PRN
  Administered 2017-07-26: 200 mg via INTRAVENOUS

## 2017-07-26 MED ORDER — ONDANSETRON HCL 4 MG/2ML IJ SOLN
4.0000 mg | Freq: Once | INTRAMUSCULAR | Status: AC | PRN
  Administered 2017-07-26: 4 mg via INTRAVENOUS

## 2017-07-26 MED ORDER — PROPOFOL 10 MG/ML IV BOLUS
INTRAVENOUS | Status: AC
  Filled 2017-07-26: qty 20

## 2017-07-26 MED ORDER — FENTANYL CITRATE (PF) 100 MCG/2ML IJ SOLN
25.0000 ug | INTRAMUSCULAR | Status: DC | PRN
  Administered 2017-07-26: 50 ug via INTRAVENOUS

## 2017-07-26 MED ORDER — FENTANYL CITRATE (PF) 250 MCG/5ML IJ SOLN
INTRAMUSCULAR | Status: AC
  Filled 2017-07-26: qty 5

## 2017-07-26 MED ORDER — KETOROLAC TROMETHAMINE 30 MG/ML IJ SOLN: 30.0000 mg | Freq: Once | INTRAMUSCULAR | Status: DC | PRN

## 2017-07-26 MED ORDER — BUPIVACAINE-EPINEPHRINE (PF) 0.5% -1:200000 IJ SOLN
INTRAMUSCULAR | Status: AC
  Filled 2017-07-26: qty 30

## 2017-07-26 MED ORDER — SUGAMMADEX SODIUM 200 MG/2ML IV SOLN
INTRAVENOUS | Status: AC
  Filled 2017-07-26: qty 2

## 2017-07-26 MED ORDER — SCOPOLAMINE 1 MG/3DAYS TD PT72
MEDICATED_PATCH | TRANSDERMAL | Status: AC
  Filled 2017-07-26: qty 1

## 2017-07-26 MED ORDER — DEXAMETHASONE SODIUM PHOSPHATE 10 MG/ML IJ SOLN
INTRAMUSCULAR | Status: AC
  Filled 2017-07-26: qty 1

## 2017-07-26 MED ORDER — 0.9 % SODIUM CHLORIDE (POUR BTL) OPTIME
TOPICAL | Status: DC | PRN
  Administered 2017-07-26: 1000 mL

## 2017-07-26 MED ORDER — MEPERIDINE HCL 25 MG/ML IJ SOLN: 6.2500 mg | INTRAMUSCULAR | Status: DC | PRN

## 2017-07-26 MED ORDER — PROPOFOL 10 MG/ML IV BOLUS
INTRAVENOUS | Status: DC | PRN
  Administered 2017-07-26: 200 mg via INTRAVENOUS

## 2017-07-26 MED ORDER — LIDOCAINE 2% (20 MG/ML) 5 ML SYRINGE
INTRAMUSCULAR | Status: DC | PRN
  Administered 2017-07-26: 100 mg via INTRAVENOUS

## 2017-07-26 MED ORDER — BACITRACIN-NEOMYCIN-POLYMYXIN OINTMENT TUBE
TOPICAL_OINTMENT | CUTANEOUS | Status: DC | PRN
  Administered 2017-07-26: 1 via TOPICAL

## 2017-07-26 MED ORDER — PHENYLEPHRINE HCL 10 MG/ML IJ SOLN
INTRAVENOUS | Status: DC | PRN
  Administered 2017-07-26: 25 ug/min via INTRAVENOUS

## 2017-07-26 MED ORDER — ARTIFICIAL TEARS OPHTHALMIC OINT
TOPICAL_OINTMENT | OPHTHALMIC | Status: DC | PRN
  Administered 2017-07-26: 1 via OPHTHALMIC

## 2017-07-26 MED ORDER — LACTATED RINGERS IV SOLN
INTRAVENOUS | Status: DC | PRN
  Administered 2017-07-26: 07:00:00 via INTRAVENOUS

## 2017-07-26 MED ORDER — CEPHALEXIN 500 MG PO CAPS: 500.0000 mg | ORAL_CAPSULE | Freq: Three times a day (TID) | ORAL | 0 refills | Status: AC

## 2017-07-26 MED ORDER — BACITRACIN 50000 UNITS IM SOLR
INTRAMUSCULAR | Status: DC | PRN
  Administered 2017-07-26: 500 mL

## 2017-07-26 MED ORDER — BUPIVACAINE-EPINEPHRINE (PF) 0.5% -1:200000 IJ SOLN
INTRAMUSCULAR | Status: DC | PRN
Start: 2017-07-26 — End: 2017-07-26
  Administered 2017-07-26: 28 mL via PERINEURAL

## 2017-07-26 MED ORDER — FENTANYL CITRATE (PF) 100 MCG/2ML IJ SOLN
INTRAMUSCULAR | Status: AC
  Filled 2017-07-26: qty 2

## 2017-07-26 SURGICAL SUPPLY — 72 items
ADH SKN CLS APL DERMABOND .7 (GAUZE/BANDAGES/DRESSINGS) ×1
ANCH LD 4 SETX2 CLIK X (Stimulator) ×1 IMPLANT
ANCHOR CLIK X NEURO (Stimulator) ×1 IMPLANT
APL SKNCLS STERI-STRIP NONHPOA (GAUZE/BANDAGES/DRESSINGS)
BAG DECANTER FOR FLEXI CONT (MISCELLANEOUS) ×2 IMPLANT
BENZOIN TINCTURE PRP APPL 2/3 (GAUZE/BANDAGES/DRESSINGS) IMPLANT
BINDER ABDOMINAL 12 ML 46-62 (SOFTGOODS) ×2 IMPLANT
BLADE CLIPPER SURG (BLADE) IMPLANT
CHLORAPREP W/TINT 26ML (MISCELLANEOUS) ×2 IMPLANT
CLIP VESOCCLUDE SM WIDE 6/CT (CLIP) IMPLANT
DERMABOND ADVANCED (GAUZE/BANDAGES/DRESSINGS) ×1
DERMABOND ADVANCED .7 DNX12 (GAUZE/BANDAGES/DRESSINGS) ×1 IMPLANT
DRAPE C-ARM 42X72 X-RAY (DRAPES) ×2 IMPLANT
DRAPE C-ARMOR (DRAPES) ×2 IMPLANT
DRAPE LAPAROTOMY 100X72X124 (DRAPES) ×2 IMPLANT
DRAPE POUCH INSTRU U-SHP 10X18 (DRAPES) ×2 IMPLANT
DRAPE SURG 17X23 STRL (DRAPES) ×2 IMPLANT
DRSG OPSITE POSTOP 3X4 (GAUZE/BANDAGES/DRESSINGS) ×1 IMPLANT
DRSG OPSITE POSTOP 4X6 (GAUZE/BANDAGES/DRESSINGS) ×1 IMPLANT
ELECT CAUTERY BLADE 6.4 (BLADE) ×1 IMPLANT
ELECT REM PT RETURN 9FT ADLT (ELECTROSURGICAL) ×2
ELECTRODE REM PT RTRN 9FT ADLT (ELECTROSURGICAL) ×1 IMPLANT
GAUZE SPONGE 4X4 16PLY XRAY LF (GAUZE/BANDAGES/DRESSINGS) ×2 IMPLANT
GENERATOR NEUROSTIMULATOR (Neurostimulator) ×1 IMPLANT
GLOVE BIOGEL PI IND STRL 7.5 (GLOVE) ×1 IMPLANT
GLOVE BIOGEL PI IND STRL 8.5 (GLOVE) IMPLANT
GLOVE BIOGEL PI INDICATOR 7.5 (GLOVE) ×1
GLOVE BIOGEL PI INDICATOR 8.5 (GLOVE) ×1
GLOVE ECLIPSE 7.5 STRL STRAW (GLOVE) ×2 IMPLANT
GLOVE EXAM NITRILE LRG STRL (GLOVE) IMPLANT
GLOVE EXAM NITRILE XL STR (GLOVE) IMPLANT
GLOVE EXAM NITRILE XS STR PU (GLOVE) IMPLANT
GLOVE SURG SS PI 6.5 STRL IVOR (GLOVE) ×1 IMPLANT
GOWN STRL REUS W/ TWL LRG LVL3 (GOWN DISPOSABLE) IMPLANT
GOWN STRL REUS W/ TWL XL LVL3 (GOWN DISPOSABLE) IMPLANT
GOWN STRL REUS W/TWL 2XL LVL3 (GOWN DISPOSABLE) IMPLANT
GOWN STRL REUS W/TWL LRG LVL3 (GOWN DISPOSABLE) ×6
GOWN STRL REUS W/TWL XL LVL3 (GOWN DISPOSABLE)
KIT BASIN OR (CUSTOM PROCEDURE TRAY) ×2 IMPLANT
KIT CHARGING (KITS) ×1
KIT CHARGING PRECISION NEURO (KITS) IMPLANT
KIT REMOTE CONTROL 112802 FREE (KITS) ×1 IMPLANT
KIT ROOM TURNOVER OR (KITS) ×2 IMPLANT
LEAD INFINION CX PERC 70CM (Lead) ×2 IMPLANT
NDL 18GX1X1/2 (RX/OR ONLY) (NEEDLE) IMPLANT
NDL 20541800 (NEEDLE) IMPLANT
NDL HYPO 25X1 1.5 SAFETY (NEEDLE) ×1 IMPLANT
NEEDLE 18GX1X1/2 (RX/OR ONLY) (NEEDLE) IMPLANT
NEEDLE 20541800 (NEEDLE) ×4 IMPLANT
NEEDLE HYPO 25X1 1.5 SAFETY (NEEDLE) ×2 IMPLANT
NS IRRIG 1000ML POUR BTL (IV SOLUTION) ×2 IMPLANT
PACK LAMINECTOMY NEURO (CUSTOM PROCEDURE TRAY) ×2 IMPLANT
PAD ARMBOARD 7.5X6 YLW CONV (MISCELLANEOUS) ×2 IMPLANT
PENCIL BUTTON HOLSTER BLD 10FT (ELECTRODE) ×1 IMPLANT
SPONGE LAP 4X18 X RAY DECT (DISPOSABLE) ×1 IMPLANT
SPONGE SURGIFOAM ABS GEL SZ50 (HEMOSTASIS) IMPLANT
STAPLER SKIN PROX WIDE 3.9 (STAPLE) ×2 IMPLANT
STRIP CLOSURE SKIN 1/2X4 (GAUZE/BANDAGES/DRESSINGS) IMPLANT
SUT MNCRL AB 4-0 PS2 18 (SUTURE) IMPLANT
SUT SILK 0 (SUTURE) ×2
SUT SILK 0 MO-6 18XCR BRD 8 (SUTURE) ×1 IMPLANT
SUT SILK 0 TIES 10X30 (SUTURE) IMPLANT
SUT SILK 2 0 TIES 10X30 (SUTURE) IMPLANT
SUT VIC AB 2-0 CP2 18 (SUTURE) ×6 IMPLANT
SYR 10ML LL (SYRINGE) IMPLANT
SYR CONTROL 10ML LL (SYRINGE) ×1 IMPLANT
SYR EPIDURAL 5ML GLASS (SYRINGE) ×2 IMPLANT
TOOL LONG TUNNEL (SPINAL CORD STIMULATOR) ×1 IMPLANT
TOWEL GREEN STERILE (TOWEL DISPOSABLE) ×2 IMPLANT
TOWEL GREEN STERILE FF (TOWEL DISPOSABLE) ×2 IMPLANT
WATER STERILE IRR 1000ML POUR (IV SOLUTION) ×2 IMPLANT
YANKAUER SUCT BULB TIP NO VENT (SUCTIONS) ×2 IMPLANT

## 2017-07-26 NOTE — Anesthesia Procedure Notes (Signed)
Procedure Name: Intubation Date/Time: 07/26/2017 7:37 AM Performed by: Bryson Corona, CRNA Pre-anesthesia Checklist: Patient identified, Emergency Drugs available, Suction available and Patient being monitored Patient Re-evaluated:Patient Re-evaluated prior to induction Oxygen Delivery Method: Circle System Utilized Preoxygenation: Pre-oxygenation with 100% oxygen Induction Type: IV induction Ventilation: Mask ventilation without difficulty Laryngoscope Size: 3 and Mac Grade View: Grade I Tube type: Oral Tube size: 7.0 mm Number of attempts: 1 Airway Equipment and Method: Stylet and Oral airway Placement Confirmation: ETT inserted through vocal cords under direct vision,  positive ETCO2 and breath sounds checked- equal and bilateral Secured at: 22 cm Tube secured with: Tape Dental Injury: Teeth and Oropharynx as per pre-operative assessment

## 2017-07-26 NOTE — Anesthesia Preprocedure Evaluation (Addendum)
Anesthesia Evaluation  Patient identified by MRN, date of birth, ID band Patient awake    Reviewed: Allergy & Precautions, NPO status , Patient's Chart, lab work & pertinent test results  Airway Mallampati: I       Dental no notable dental hx. (+) Teeth Intact   Pulmonary neg pulmonary ROS,    Pulmonary exam normal breath sounds clear to auscultation       Cardiovascular Normal cardiovascular exam Rhythm:Regular Rate:Normal     Neuro/Psych    GI/Hepatic Neg liver ROS,   Endo/Other  negative endocrine ROS  Renal/GU negative Renal ROS  negative genitourinary   Musculoskeletal   Abdominal (+) + obese,   Peds  Hematology   Anesthesia Other Findings   Reproductive/Obstetrics                            Anesthesia Physical Anesthesia Plan  ASA: II  Anesthesia Plan: General   Post-op Pain Management:    Induction: Intravenous  PONV Risk Score and Plan: 4 or greater and Ondansetron, Dexamethasone and Scopolamine patch - Pre-op  Airway Management Planned: Oral ETT  Additional Equipment:   Intra-op Plan:   Post-operative Plan: Extubation in OR  Informed Consent: I have reviewed the patients History and Physical, chart, labs and discussed the procedure including the risks, benefits and alternatives for the proposed anesthesia with the patient or authorized representative who has indicated his/her understanding and acceptance.   Dental advisory given  Plan Discussed with: CRNA and Surgeon  Anesthesia Plan Comments:         Anesthesia Quick Evaluation

## 2017-07-26 NOTE — Anesthesia Postprocedure Evaluation (Signed)
Anesthesia Post Note  Patient: [name]  Procedure(s) Performed: LUMBAR SPINAL CORD STIMULATOR INSERTION (Back)     Patient location during evaluation: PACU Anesthesia Type: General Level of consciousness: sedated Pain management: pain level controlled Vital Signs Assessment: post-procedure vital signs reviewed and stable Respiratory status: spontaneous breathing Cardiovascular status: stable Anesthetic complications: yes Anesthetic complication details: PONV   Last Vitals:  Vitals:   07/26/17 0945 07/26/17 1000  BP: (!) 142/92 115/67  Pulse: 91 78  Resp: 14 19  Temp:    SpO2: 95% 94%    Last Pain:  Vitals:   07/26/17 1000  TempSrc:   PainSc: Asleep   Pain Goal: Patients Stated Pain Goal: 3 (07/26/17 0622)               Elaine Roanhorse JR,JOHN Susann GivensFRANKLIN

## 2017-07-26 NOTE — Discharge Instructions (Addendum)

## 2017-07-26 NOTE — Op Note (Signed)
PREOP DX: 1) lumbago  2) lumbar radiculopathy  3) chronic pain  POSTOP DX: same as preop PROCEDURES PERFORMED:1) intraop fluoro 2) placement of 2 16 contact [employer] Infinion leads 3) placement of Spectra SCS generator  SURGEON:Juanisha Bautch  ASSISTANT: NONE  ANESTHESIA:MAC/TIVA EBL: <20cc  DESCRIPTION OF PROCEDURE: After a discussion of risks, benefits and alternatives, informed consent was obtained. The patient was taken to the OR,general anesthesia induced by the anesthesia team,turned prone onto a Jackson table, all pressure points padded, SCD's placed, and an adequate plane of anesthesia induced. A timeout was taken to verify the correct patient, position, personnel, availability of appropriate equipment, and administration of perioperative antibiotics.   The thoracic and lumbar areas were widely prepped with chloraprep and draped into a sterile field. Fluoroscopy was used to plan arightparamedian incision at theL1-L2 levels, and an incision made with a 10 blade and carried down to the dorsolumbar fascia with the bovie and blunt dissection. Retractors were placed and a 14g [employer] tuohy needle placed into the epidural space at the T12-L1 interspace using biplanar fluoro and loss-of-resistance technique. The needle was aspirated without any return of fluid. A [employer] INFINION lead was introduced and under live AP fluoro advanced until the2distal-most contactsoverlay the inferior aspect T7vertebral body shadow with the rest of the contacts distributed over the T8and T9 vertebral bodies in a position justatanatomic midline. A second Infinion lead was placed left of anatomic midline in the same levels using the same technique. The patient was awakened, and coverage tested. Patient reported good coverage in all of his pain areas.  Adequate sedation was re-established, and0 silk sutures were placed in the fascia  adjacent to the needles. The needles and stylets were removed under fluoroscopy with no lead migration noted. Leads were then fixed to the fascia byClik anchorswith the sutures; repeat images were obtained to verify that there had been no lead migration.  The incision was inspected and hemostasis obtained with the bipolar cautery.  Attention was then turned to creation of a subcutaneous pocket. At therightflank, a 3 cm incision was made with a 10 blade and using the bovie and blunt dissection a pocket of size appropriate to place a SCS generator. The pocket was trialed, and found to be of adequate size. The pocket was inspected for hemostasis, which was found to be excellent. Using reverse seldinger technique, the leads were tunneled to the pocket site, and the leads inserted into the SCS generator. Impedances were checked, and all found to be excellent. The leads were then all fixed into position with a self-torquing wrench. The wiring was all carefully coiled, placed behind the generator and placed in the pocket.  Both incisions were copiously irrigated with bacitracin-containing irrigation. The lumbar incision was closed in 2 deep layers of interrupted 2-0 vicryl and the skin closed with staples. The pocket incision was closed with a deeper layer of 2-0 vicryl interrupted sutures, and the skin closed with staples. Sterile dressings were applied. Needle, sponge, and instrument counts were correct x2 at the end of the case.  The patient was then carefully awakened from anesthesia, turned supine, an abdominal binder placed, and the patient taken to the recovery room where he underwent complex spinal cord stimulator programming.  COMPLICATIONS: NONE  CONDITION: Stable throughout the course of the procedure and immediately afterward  DISPOSITION: discharge to home, with antibiotics and pain medicine. Discussed care with the patient and spouse. Followup in clinic will be scheduled in 10-14 days.

## 2017-07-26 NOTE — Transfer of Care (Signed)
Immediate Anesthesia Transfer of Care Note  Patient: [name]  Procedure(s) Performed: LUMBAR SPINAL CORD STIMULATOR INSERTION (Back)  Patient Location: PACU  Anesthesia Type:General  Level of Consciousness: awake, alert  and oriented  Airway & Oxygen Therapy: Patient Spontanous Breathing and Patient connected to face mask oxygen  Post-op Assessment: Report given to RN and Post -op Vital signs reviewed and stable  Post vital signs: Reviewed and stable  Last Vitals:  Vitals:   07/26/17 0602  BP: (!) 145/79  Pulse: 81  Resp: 19  Temp: 36.7 C  SpO2: 97%    Last Pain:  Vitals:   07/26/17 0622  TempSrc:   PainSc: 9       Patients Stated Pain Goal: 3 (07/26/17 0622)  Complications: No apparent anesthesia complications

## 2017-07-27 ENCOUNTER — Encounter (HOSPITAL_COMMUNITY): Payer: Self-pay | Admitting: Anesthesiology

## 2017-07-30 DIAGNOSIS — K644 Residual hemorrhoidal skin tags: Secondary | ICD-10-CM | POA: Insufficient documentation

## 2017-07-30 DIAGNOSIS — R0789 Other chest pain: Secondary | ICD-10-CM | POA: Insufficient documentation

## 2017-07-31 ENCOUNTER — Emergency Department (HOSPITAL_COMMUNITY)
Admission: EM | Admit: 2017-07-31 | Discharge: 2017-07-31 | Disposition: A | Discharge status: Home / Self Care | Attending: Emergency Medicine | Admitting: Emergency Medicine

## 2017-07-31 ENCOUNTER — Encounter (HOSPITAL_COMMUNITY): Payer: Self-pay

## 2017-07-31 ENCOUNTER — Emergency Department (HOSPITAL_COMMUNITY)

## 2017-07-31 ENCOUNTER — Other Ambulatory Visit: Payer: Self-pay

## 2017-07-31 DIAGNOSIS — K644 Residual hemorrhoidal skin tags: Secondary | ICD-10-CM

## 2017-07-31 DIAGNOSIS — R0789 Other chest pain: Secondary | ICD-10-CM

## 2017-07-31 LAB — BASIC METABOLIC PANEL
ANION GAP: 9 (ref 5–15)
BUN: 19 mg/dL (ref 6–20)
CHLORIDE: 104 mmol/L (ref 101–111)
CO2: 25 mmol/L (ref 22–32)
Calcium: 9.3 mg/dL (ref 8.9–10.3)
Creatinine, Ser: 0.82 mg/dL (ref 0.44–1.00)
GFR calc non Af Amer: >60 mL/min (ref >60)
Glucose, Bld: 132 mg/dL — ABNORMAL HIGH (ref 65–99)
Potassium: 3.3 mmol/L — ABNORMAL LOW (ref 3.5–5.1)
Sodium: 138 mmol/L (ref 135–145)

## 2017-07-31 LAB — CBC
HEMATOCRIT: 36.7 % (ref 36.0–46.0)
HEMOGLOBIN: 12 g/dL (ref 12.0–15.0)
MCH: 28.4 pg (ref 26.0–34.0)
MCHC: 32.7 g/dL (ref 30.0–36.0)
MCV: 87 fL (ref 78.0–100.0)
Platelets: 227 10*3/uL (ref 150–400)
RBC: 4.22 MIL/uL (ref 3.87–5.11)
RDW: 13.6 % (ref 11.5–15.5)
WBC: 5.5 10*3/uL (ref 4.0–10.5)

## 2017-07-31 LAB — I-STAT BETA HCG BLOOD, ED (MC, WL, AP ONLY): I-stat hCG, quantitative: <5 m[IU]/mL (ref <5)

## 2017-07-31 LAB — I-STAT TROPONIN, ED: Troponin i, poc: 0 ng/mL (ref 0.00–0.08)

## 2017-07-31 MED ORDER — IOPAMIDOL (ISOVUE-370) INJECTION 76%
100.0000 mL | Freq: Once | INTRAVENOUS | Status: AC | PRN
  Administered 2017-07-31: 100 mL via INTRAVENOUS

## 2017-07-31 MED ORDER — HYDROCORTISONE ACETATE 25 MG RE SUPP: 25.0000 mg | Freq: Two times a day (BID) | RECTAL | 0 refills | Status: DC

## 2017-07-31 MED ORDER — KETOROLAC TROMETHAMINE 30 MG/ML IJ SOLN
30.0000 mg | Freq: Once | INTRAMUSCULAR | Status: AC
  Administered 2017-07-31: 30 mg via INTRAVENOUS
  Filled 2017-07-31: qty 1

## 2017-07-31 MED ORDER — NAPROXEN 500 MG PO TABS: 500.0000 mg | ORAL_TABLET | Freq: Two times a day (BID) | ORAL | 0 refills | Status: DC

## 2017-07-31 MED ORDER — POTASSIUM CHLORIDE CRYS ER 20 MEQ PO TBCR
40.0000 meq | EXTENDED_RELEASE_TABLET | Freq: Once | ORAL | Status: AC
  Administered 2017-07-31: 40 meq via ORAL
  Filled 2017-07-31: qty 2

## 2017-07-31 MED ORDER — IOPAMIDOL (ISOVUE-370) INJECTION 76%
INTRAVENOUS | Status: AC
  Administered 2017-07-31: 100 mL via INTRAVENOUS
  Filled 2017-07-31: qty 100

## 2017-07-31 NOTE — ED Triage Notes (Signed)
Patient complaining of left chest pain since Thursday. Patient states she had surgery on Thursday morning and has had chest pain every since she went home.

## 2017-07-31 NOTE — ED Provider Notes (Signed)
[facility] DE[facility] Provider Note   CSN: [id number] Arrival date & time: 07/30/17  2351     History   Chief Complaint Chief Complaint  Patient presents with  . Chest Pain  . Hemorrhoids    HPI [name] is a 51 y.o. female.  The history is provided by the patient.  Chest Pain    She complains of sharp lower sternal chest pain for the last 4 days.  Symptoms started after she had surgical procedure for implantation of a nerve stimulator.  Pain is worse when she stands up and also worse with movement.  It is worse with taking deep breath when she is standing up.  She rates pain a 10/10 when standing.  She denies dyspnea or nausea.  She has had some episodes of diaphoresis.  She is also complaining of hemorrhoids.  Hemorrhoids have been a chronic problem and she has been using Preparation H without relief.  She denies tobacco use.  She has a history of hyperlipidemia, but no history of diabetes or hypertension and there is no family history of premature coronary atherosclerosis.  Of note, she takes oxycodone for her back pain and this does seem to give some relief of her chest pain as well.  Past Medical History:  Diagnosis Date  . Arthritis   . Depression   . GERD (gastroesophageal reflux disease)   . Hyperlipidemia 03/03/2014    Patient Active Problem List   Diagnosis Date Noted  . Chronic foot pain 08/26/2015  . Night sweats 02/04/2015  . Atypical chest pain 02/04/2015  . Myalgia 04/10/2014  . Headache(784.0) 04/03/2014  . Insomnia 04/03/2014  . Obesity 04/03/2014  . Hyperlipidemia 03/03/2014  . Arthritis 03/03/2014  . Seasonal allergies 03/03/2014    Past Surgical History:  Procedure Laterality Date  . ABDOMINAL HYSTERECTOMY     partial-still has ovaries. Benign reason. Unsure about cervix   . ACHILLES TENDON SURGERY     2017   left  . BREAST SURGERY     nodule in breast removed-not cancer  . CHOLECYSTECTOMY    . NEUROPLASTY /  TRANSPOSITION MEDIAN NERVE AT CARPAL TUNNEL BILATERAL Bilateral   . SPINAL CORD STIMULATOR INSERTION  07/26/2017   Procedure: LUMBAR SPINAL CORD STIMULATOR INSERTION;  Surgeon: Clydell Hakim, MD;  Location: [place];  Service: Neurosurgery;;    OB History    No data available       Home Medications    Prior to Admission medications   Medication Sig Start Date End Date Taking? Authorizing Provider  acetaminophen (TYLENOL) 500 MG tablet Take 1,000 mg by mouth every 6 (six) hours as needed for mild pain, moderate pain, fever or headache.   Yes [provider]  calcium carbonate (OS-CAL) 600 MG TABS tablet Take 600 mg by mouth at bedtime.    Yes [provider]  cephALEXin (KEFLEX) 500 MG capsule Take 1 capsule (500 mg total) by mouth 3 (three) times daily for 10 days. Patient taking differently: Take 500 mg by mouth 3 (three) times daily. Started 12/20 for 10 days 07/26/17 08/05/17 Yes Clydell Hakim, MD  Cholecalciferol (VITAMIN D3) 2000 units TABS Take 2,000 Units by mouth at bedtime.   Yes [provider]  fluocinonide (LIDEX) 0.05 % external solution Apply 1 application topically daily. 06/14/17  Yes [provider]  montelukast (SINGULAIR) 10 MG tablet Take 1 tablet (10 mg total) by mouth at bedtime. Patient taking differently: Take 10 mg by mouth daily as needed (for  seasonal allergies.).  03/03/14  Yes Marin Olp, MD  [facility] 4 MG/0.1ML LIQD nasal spray kit Place 4 mg into the nose as directed. 05/10/17  Yes [provider]  oxyCODONE (ROXICODONE) 15 MG immediate release tablet Take 15 mg by mouth every 4 (four) hours as needed for pain.   Yes [provider]  oxyCODONE-acetaminophen (PERCOCET) 10-325 MG tablet Take 1 tablet by mouth every 4 (four) hours as needed for pain. 07/26/17  Yes Clydell Hakim, MD  pregabalin (LYRICA) 50 MG capsule Take 50 mg by mouth 3 (three) times daily. Morning, midday, bedtime   Yes [provider]  promethazine (PHENERGAN) 25 MG tablet Take 25 mg by mouth every 6 (six) hours as needed for nausea or vomiting.   Yes [provider]    Family History Family History  Problem Relation Age of Onset  . Hypertension Mother   . Hypertension Father   . Diabetes Father   . Cancer Paternal Grandmother        unsure  . Cancer Paternal Grandfather        prostate  . Diabetes type II Daughter   . Healthy Son     Social History Social History   Tobacco Use  . Smoking status: Never Smoker  . Smokeless tobacco: Never Used  Substance Use Topics  . Alcohol use: No    Alcohol/week: 0.0 oz  . Drug use: No     Allergies   Darvocet [propoxyphene n-acetaminophen]; Percocet [oxycodone-acetaminophen]; Shrimp [shellfish allergy]; Gabapentin; and Latex   Review of Systems Review of Systems  Cardiovascular: Positive for chest pain.  All other systems reviewed and are negative.    Physical Exam Updated Vital Signs BP 140/87   Pulse 82   Temp 98.2 F (36.8 C) (Oral)   Resp (!) 23   Ht 5' 3" (1.6 m)   Wt 93 kg (205 lb)   SpO2 96%   BMI 36.31 kg/m   Physical Exam  Nursing note and vitals reviewed.  51 year old female, resting comfortably and in no acute distress. Vital signs are normal. Oxygen saturation is 96%, which is normal. Head is normocephalic and atraumatic. PERRLA, EOMI. Oropharynx is clear. Neck is nontender and supple without adenopathy or JVD. Back is nontender and there is no CVA tenderness. Lungs are clear without rales, wheezes, or rhonchi. Chest is tender over the lower sternal area.  This does reproduce her pain. Heart has regular rate and rhythm without murmur. Abdomen is soft, flat, nontender without masses or hepatosplenomegaly and peristalsis is normoactive. Rectal: Prominent external hemorrhoids without evidence of thrombosis.  Surgical site on the gluteal area noted with staples in place and no sign of infection. Extremities have no cyanosis  or edema, full range of motion is present. Skin is warm and dry without rash. Neurologic: Mental status is normal, cranial nerves are intact, there are no motor or sensory deficits.  ED Treatments / Results  Labs (all labs ordered are listed, but only abnormal results are displayed) Labs Reviewed  BASIC METABOLIC PANEL - Abnormal; Notable for the following components:      Result Value   Potassium 3.3 (*)    Glucose, Bld 132 (*)    All other components within normal limits  CBC  I-STAT TROPONIN, ED  I-STAT BETA HCG BLOOD, ED ([facility], [facility], [facility] ONLY)    EKG  EKG Interpretation  Date/Time:  Tuesday July 31 2017 00:10:17 EST Ventricular Rate:  100 PR Interval:  QRS Duration: 79 QT Interval:  386 QTC Calculation: 498 R Axis:   83 Text Interpretation:  Sinus tachycardia Borderline T abnormalities, anterior leads Borderline prolonged QT interval When compared with ECG of 01/09/2015, QT has lengthened Confirmed by Delora Fuel ([id number]) on 07/31/2017 12:28:22 AM       Radiology Dg Chest 2 View  Result Date: 07/31/2017 CLINICAL DATA:  Acute onset of left-sided chest pain. EXAM: CHEST  2 VIEW COMPARISON:  Chest radiograph performed 01/19/2015 FINDINGS: The lungs are well-aerated. Minimal left basilar atelectasis is noted. There is no evidence of pleural effusion or pneumothorax. The heart is normal in size; the mediastinal contour is within normal limits. No acute osseous abnormalities are seen. Thoracic spinal stimulation leads are noted. IMPRESSION: Minimal left basilar atelectasis.  Lungs otherwise clear. Electronically Signed   By: Garald Balding M.D.   On: 07/31/2017 00:32   Ct Angio Chest Pe W And/or Wo Contrast  Result Date: 07/31/2017 CLINICAL DATA:  51 year old female with left-sided chest pain. Concern for pulmonary embolism. EXAM: CT ANGIOGRAPHY CHEST WITH CONTRAST TECHNIQUE: Multidetector CT imaging of the chest was performed using the standard protocol during bolus  administration of intravenous contrast. Multiplanar CT image reconstructions and MIPs were obtained to evaluate the vascular anatomy. CONTRAST:  100 cc Isovue 370 COMPARISON:  Chest radiograph dated 07/31/2017 FINDINGS: Evaluation is limited due to streak artifact caused by patient's arms. Cardiovascular: There is no cardiomegaly or pericardial effusion. The thoracic aorta is unremarkable. 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 Evaluation of the pulmonary arteries is limited due to suboptimal opacification of the peripheral branches as well as secondary to streak artifact. No large or central pulmonary artery embolus identified. Mediastinum/Nodes: Top-normal left hilar lymph nodes. No mediastinal adenopathy. Esophagus and the thyroid gland are grossly unremarkable. Lungs/Pleura: Left lung base linear atelectasis or scarring. Right middle lobe subpleural nodule measures 3-4 mm. There is no pleural effusion pneumothorax. The central airways appear patent. Upper Abdomen: There is mild eventration of the left hemidiaphragm. The visualized upper abdomen is unremarkable. Musculoskeletal: No acute osseous pathology. Thoracic spine known stimulator noted. Review of the MIP images confirms the above findings. IMPRESSION: 1. No CT evidence of central pulmonary artery embolus. Evaluation of the pulmonary arteries is limited due to suboptimal opacification and streak artifact. 2. Mild eventration of the left hemidiaphragm with left lung base subsegmental atelectasis. No pleural effusion or pneumothorax. Electronically Signed   By: Anner Crete M.D.   On: 07/31/2017 03:37    Procedures Procedures (including critical care time)  Medications Ordered in ED Medications  potassium chloride SA (K-DUR,KLOR-CON) CR tablet 40 mEq (40 mEq Oral Given 07/31/17 0352)  ketorolac (TORADOL) 30 MG/ML injection 30 mg (30 mg Intravenous Given 07/31/17 0352)  iopamidol (ISOVUE-370) 76  % injection 100 mL (100 mLs Intravenous Contrast Given 07/31/17 0303)     Initial Impression / Assessment and Plan / ED Course  I have reviewed the triage vital signs and the nursing notes.  Pertinent labs & imaging results that were available during my care of the patient were reviewed by me and considered in my medical decision making (see chart for details).  Chest pain which seems to be musculoskeletal.  Patient at low risk for ACS.  Heart score equals 2 which places her at very low risk for major adverse cardiac events in the next 30 days.  Symptoms did start after surgery, so PE is a possibility.  She will be sent for  CT angiogram.  However, she had to be placed prone for the procedure and I suspect that her pain is musculoskeletal related to how she was placed for her surgical procedure.  Old records are reviewed confirming recent surgery for implantation of nerve stimulator.  Also, prior ED visit for atypical chest pain.  She will be given a dose of ketorolac.  Hemorrhoids are not thrombosed, so no indication for ED incision and drainage.  She will be referred to general surgery for consideration for possible hemorrhoidectomy.  CT angiogram showed no evidence of pulmonary embolism.  She is given a dose of ketorolac with significant improvement in her chest wall pain.  Mild hypokalemia is noted and she is given a single dose of oral potassium.  She is not on any diuretics, and I do not feel she will need ongoing potassium supplementation.  She is discharged with prescription for naproxen and Anusol HC suppositories.  She is referred to general surgery for evaluation for possible hemorrhoidectomy.  Final Clinical Impressions(s) / ED Diagnoses   Final diagnoses:  Chest wall pain  External hemorrhoids    ED Discharge Orders        Ordered    naproxen (NAPROSYN) 500 MG tablet  2 times daily     07/31/17 0449    hydrocortisone (ANUSOL-HC) 25 MG suppository  2 times daily     02/54/27 0623         Delora Fuel, MD 76/28/31 [phone]

## 2017-07-31 NOTE — ED Notes (Signed)
Patient denies pain and is resting comfortably.  

## 2017-07-31 NOTE — Discharge Instructions (Signed)
Apply ice or heat to your chest as needed. Continue taking your pain medication. Return if you are having any problems.  Man appointment with the surgeon to evaluate your hemorrhoids.

## 2017-10-22 ENCOUNTER — Encounter (HOSPITAL_COMMUNITY): Payer: Self-pay | Admitting: Emergency Medicine

## 2017-10-22 ENCOUNTER — Emergency Department (HOSPITAL_COMMUNITY)
Admission: EM | Admit: 2017-10-22 | Discharge: 2017-10-22 | Disposition: A | Discharge status: Home / Self Care | Attending: Emergency Medicine | Admitting: Emergency Medicine

## 2017-10-22 DIAGNOSIS — R2241 Localized swelling, mass and lump, right lower limb: Secondary | ICD-10-CM | POA: Insufficient documentation

## 2017-10-22 DIAGNOSIS — Z5321 Procedure and treatment not carried out due to patient leaving prior to being seen by health care provider: Secondary | ICD-10-CM | POA: Diagnosis not present

## 2017-10-22 NOTE — ED Triage Notes (Signed)
Patient presents with family stating her right leg has been swollen for a long time. States she has had blood work and US of leg on the 22nd to rule out blood clot. PCP states swelling is related to her back issues causing nerve issues.

## 2017-10-22 NOTE — ED Notes (Signed)
Patient gave registration labels and states they were no longer waiting to be seen.

## 2018-07-24 ENCOUNTER — Other Ambulatory Visit: Payer: Self-pay | Admitting: Anesthesiology

## 2018-08-12 NOTE — Pre-Procedure Instructions (Signed)
[name]  08/12/2018      [id number] [place], [place] - [address] [place] [address] Phone: [phone] Fax: [phone]    Your procedure is scheduled on January 10th.  Report to [facility] Admitting at 0530 A.M.  Call this number if you have problems the morning of surgery:  [phone]   Remember:  Do not eat or drink after midnight.   Take these medicines the morning of surgery with A SIP OF WATER  acetaminophen (TYLENOL) if needed docusate sodium (COLACE)  montelukast (SINGULAIR)  If needed omeprazole (PRILOSEC) pregabalin (LYRICA) sucralfate (CARAFATE)  venlafaxine XR (EFFEXOR-XR)   7 days prior to surgery STOP taking any Aspirin (unless otherwise instructed by your surgeon), Aleve, Naproxen, Ibuprofen, Motrin, Advil, Goody's, BC's, all herbal medications, fish oil, and all vitamins.     Do not wear jewelry, make-up or nail polish.  Do not wear lotions, powders, or perfumes, or deodorant.  Do not shave 48 hours prior to surgery.  Men may shave face and neck.  Do not bring valuables to the hospital.  [facility] is not responsible for any belongings or valuables.  Contacts, dentures or bridgework may not be worn into surgery.  Leave your suitcase in the car.  After surgery it may be brought to your room.  For patients admitted to the hospital, discharge time will be determined by your treatment team.  Patients discharged the day of surgery will not be allowed to drive home.    [place]- Preparing For Surgery  Before surgery, you can play an important role. Because skin is not sterile, your skin needs to be as free of germs as possible. You can reduce the number of germs on your skin by washing with CHG (chlorahexidine gluconate) Soap before surgery.  CHG is an antiseptic cleaner which kills germs and bonds with the skin to continue killing germs even after washing.    Oral Hygiene is also  important to reduce your risk of infection.  Remember - BRUSH YOUR TEETH THE MORNING OF SURGERY WITH YOUR REGULAR TOOTHPASTE  Please do not use if you have an allergy to CHG or antibacterial soaps. If your skin becomes reddened/irritated stop using the CHG.  Do not shave (including legs and underarms) for at least 48 hours prior to first CHG shower. It is OK to shave your face.  Please follow these instructions carefully.   1. Shower the NIGHT BEFORE SURGERY and the MORNING OF SURGERY with CHG.   2. If you chose to wash your hair, wash your hair first as usual with your normal shampoo.  3. After you shampoo, rinse your hair and body thoroughly to remove the shampoo.  4. Use CHG as you would any other liquid soap. You can apply CHG directly to the skin and wash gently with a scrungie or a clean washcloth.   5. Apply the CHG Soap to your body ONLY FROM THE NECK DOWN.  Do not use on open wounds or open sores. Avoid contact with your eyes, ears, mouth and genitals (private parts). Wash Face and genitals (private parts)  with your normal soap.  6. Wash thoroughly, paying special attention to the area where your surgery will be performed.  7. Thoroughly rinse your body with warm water from the neck down.  8. DO NOT shower/wash with your normal soap after using and rinsing off the CHG Soap.  9. Pat yourself dry with a CLEAN  TOWEL.  10. Wear CLEAN PAJAMAS to bed the night before surgery, wear comfortable clothes the morning of surgery  11. Place CLEAN SHEETS on your bed the night of your first shower and DO NOT SLEEP WITH PETS.    Day of Surgery:  Do not apply any deodorants/lotions.  Please wear clean clothes to the hospital/surgery center.   Remember to brush your teeth WITH YOUR REGULAR TOOTHPASTE.    Please read over the following fact sheets that you were given.

## 2018-08-13 ENCOUNTER — Encounter (HOSPITAL_COMMUNITY): Payer: Self-pay

## 2018-08-13 ENCOUNTER — Encounter (HOSPITAL_COMMUNITY)
Admission: RE | Admit: 2018-08-13 | Discharge: 2018-08-13 | Disposition: A | Discharge status: Home / Self Care | Payer: Medicare Other | Source: Ambulatory Visit | Attending: Anesthesiology | Admitting: Anesthesiology

## 2018-08-13 ENCOUNTER — Other Ambulatory Visit: Payer: Self-pay

## 2018-08-13 DIAGNOSIS — Z888 Allergy status to other drugs, medicaments and biological substances status: Secondary | ICD-10-CM | POA: Diagnosis not present

## 2018-08-13 DIAGNOSIS — F329 Major depressive disorder, single episode, unspecified: Secondary | ICD-10-CM | POA: Diagnosis not present

## 2018-08-13 DIAGNOSIS — Z91013 Allergy to seafood: Secondary | ICD-10-CM | POA: Diagnosis not present

## 2018-08-13 DIAGNOSIS — G894 Chronic pain syndrome: Secondary | ICD-10-CM | POA: Diagnosis not present

## 2018-08-13 DIAGNOSIS — Z01812 Encounter for preprocedural laboratory examination: Secondary | ICD-10-CM | POA: Insufficient documentation

## 2018-08-13 DIAGNOSIS — K219 Gastro-esophageal reflux disease without esophagitis: Secondary | ICD-10-CM | POA: Diagnosis not present

## 2018-08-13 DIAGNOSIS — Z885 Allergy status to narcotic agent status: Secondary | ICD-10-CM | POA: Diagnosis not present

## 2018-08-13 DIAGNOSIS — M5416 Radiculopathy, lumbar region: Secondary | ICD-10-CM | POA: Diagnosis not present

## 2018-08-13 DIAGNOSIS — Z462 Encounter for fitting and adjustment of other devices related to nervous system and special senses: Secondary | ICD-10-CM | POA: Diagnosis not present

## 2018-08-13 DIAGNOSIS — Z79899 Other long term (current) drug therapy: Secondary | ICD-10-CM | POA: Diagnosis not present

## 2018-08-13 HISTORY — DX: Dorsalgia, unspecified: M54.9

## 2018-08-13 HISTORY — DX: Other chronic pain: G89.29

## 2018-08-13 HISTORY — DX: Polyneuropathy, unspecified: G62.9

## 2018-08-13 LAB — BASIC METABOLIC PANEL
Anion gap: 8 (ref 5–15)
BUN: 16 mg/dL (ref 6–20)
CALCIUM: 9.8 mg/dL (ref 8.9–10.3)
CHLORIDE: 106 mmol/L (ref 98–111)
CO2: 23 mmol/L (ref 22–32)
CREATININE: 0.74 mg/dL (ref 0.44–1.00)
GFR calc Af Amer: >60 mL/min (ref >60)
GFR calc non Af Amer: >60 mL/min (ref >60)
Glucose, Bld: 101 mg/dL — ABNORMAL HIGH (ref 70–99)
Potassium: 4.2 mmol/L (ref 3.5–5.1)
Sodium: 137 mmol/L (ref 135–145)

## 2018-08-13 LAB — CBC
HEMATOCRIT: 40.8 % (ref 36.0–46.0)
Hemoglobin: 12.6 g/dL (ref 12.0–15.0)
MCH: 27.4 pg (ref 26.0–34.0)
MCHC: 30.9 g/dL (ref 30.0–36.0)
MCV: 88.7 fL (ref 80.0–100.0)
PLATELETS: 259 10*3/uL (ref 150–400)
RBC: 4.6 MIL/uL (ref 3.87–5.11)
RDW: 13.5 % (ref 11.5–15.5)
WBC: 4.7 10*3/uL (ref 4.0–10.5)
nRBC: 0 % (ref 0.0–0.2)

## 2018-08-13 LAB — SURGICAL PCR SCREEN
MRSA, PCR: NEGATIVE
Staphylococcus aureus: NEGATIVE

## 2018-08-13 NOTE — Progress Notes (Addendum)
PCP - Juliann Pares, DO-[facility] Cardiologist - Denies  Chest x-ray - N/A EKG - N/A; requested  Stress Test -denies  ECHO - denies Cardiac Cath - denies  Sleep Study - denies  Aspirin Instructions: N/A  Anesthesia review: No  Patient denies shortness of breath, fever, cough and chest pain at PAT appointment. HR was elevated at 107 both before and after appointment. Pt stated having a recent EKG done at PCP office. Requested EKG tracing.    Patient verbalized understanding of instructions that were given to them at the PAT appointment. Patient was also instructed that they will need to review over the PAT instructions again at home before surgery.

## 2018-08-13 NOTE — Pre-Procedure Instructions (Signed)
[name]  08/13/2018      [id number] [place], [place] - [address] [place] [address] Phone: [phone] Fax: [phone]    Your procedure is scheduled on January 10th.  Report to [facility] Admitting at 0530 A.M.  Call this number if you have problems the morning of surgery:  [phone]   Remember:  Do not eat or drink after midnight.   Take these medicines the morning of surgery with A SIP OF WATER  acetaminophen (TYLENOL) if needed oxyCODONE-acetaminophen (PERCOCET)-if needed montelukast (SINGULAIR)  If needed omeprazole (PRILOSEC) pregabalin (LYRICA) venlafaxine XR (EFFEXOR-XR)   7 days prior to surgery STOP taking any Aspirin (unless otherwise instructed by your surgeon), Aleve, Naproxen, Ibuprofen, Motrin, Advil, Goody's, BC's, all herbal medications, fish oil, and all vitamins.     Do not wear jewelry, make-up or nail polish.  Do not wear lotions, powders, or perfumes, or deodorant.  Do not shave 48 hours prior to surgery.   Do not bring valuables to the hospital.  [facility] is not responsible for any belongings or valuables.  Contacts, dentures or bridgework may not be worn into surgery.  Leave your suitcase in the car.  After surgery it may be brought to your room.  For patients admitted to the hospital, discharge time will be determined by your treatment team.  Patients discharged the day of surgery will not be allowed to drive home.    [place]- Preparing For Surgery  Before surgery, you can play an important role. Because skin is not sterile, your skin needs to be as free of germs as possible. You can reduce the number of germs on your skin by washing with CHG (chlorahexidine gluconate) Soap before surgery.  CHG is an antiseptic cleaner which kills germs and bonds with the skin to continue killing germs even after washing.    Oral Hygiene is also important to reduce your risk of  infection.  Remember - BRUSH YOUR TEETH THE MORNING OF SURGERY WITH YOUR REGULAR TOOTHPASTE  Please do not use if you have an allergy to CHG or antibacterial soaps. If your skin becomes reddened/irritated stop using the CHG.  Do not shave (including legs and underarms) for at least 48 hours prior to first CHG shower. It is OK to shave your face.  Please follow these instructions carefully.   1. Shower the NIGHT BEFORE SURGERY and the MORNING OF SURGERY with CHG.   2. If you chose to wash your hair, wash your hair first as usual with your normal shampoo.  3. After you shampoo, rinse your hair and body thoroughly to remove the shampoo.  4. Use CHG as you would any other liquid soap. You can apply CHG directly to the skin and wash gently with a scrungie or a clean washcloth.   5. Apply the CHG Soap to your body ONLY FROM THE NECK DOWN.  Do not use on open wounds or open sores. Avoid contact with your eyes, ears, mouth and genitals (private parts). Wash Face and genitals (private parts)  with your normal soap.  6. Wash thoroughly, paying special attention to the area where your surgery will be performed.  7. Thoroughly rinse your body with warm water from the neck down.  8. DO NOT shower/wash with your normal soap after using and rinsing off the CHG Soap.  9. Pat yourself dry with a CLEAN TOWEL.  10. Wear CLEAN PAJAMAS to bed the night  before surgery, wear comfortable clothes the morning of surgery  11. Place CLEAN SHEETS on your bed the night of your first shower and DO NOT SLEEP WITH PETS.    Day of Surgery:  Do not apply any deodorants/lotions.  Please wear clean clothes to the hospital/surgery center.   Remember to brush your teeth WITH YOUR REGULAR TOOTHPASTE.    Please read over the following fact sheets that you were given.

## 2018-08-14 NOTE — Progress Notes (Signed)
Anesthesia Chart Review:  Case:  [id number] Date/Time:  08/16/18 0700   Procedure:  LUMBAR SPINAL CORD STIMULATOR REMOVAL (N/A )   Anesthesia type:  General   Pre-op diagnosis:  Lumbar post-laminectomy syndrome   Location:  [facility] OR ROOM 19 / [place] [facility]   Surgeon:  Clydell Hakim, MD      DISCUSSION: Patient is a 53 year old female scheduled for the above procedure. S/p lumbar spinal cord insertion 07/26/17, but apparently not helping as much as anticipated.   History includes never smoker, HLD, GERD, chronic pack pain, neuropathy.  S/p right achilles tendon debridement and repair 12/05/17. BMI is consistent with obesity. . Patient had recent PCP evaluation for neck pain on 07/25/18 and prescribed cyclobenzaprine.Slight right arm weakness documented as "unchanged." CBC, CMET and sed rate unremarkable. EKG as outlined below. She was in SR at 75 bpm. HR at PAT was 107 bpm. Anesthesia APP not notified while patient at PAT, but patient denied SOB, cough, fever, and chest pain, so may have been pain related. (Preoperative labs overall normal.) She will get vitals on arrival on the day of surgery, If she remains mildly tachycardic then could consider repeat EKG but rhythm can likely be assessed when she is placed on telemetry perioperatively. Anesthesiologist to evaluate on the day of surgery.    VS: BP 135/80   Pulse (!) 107   Temp 36.8 C   Ht _0  (1.6 m)   Wt 91.7 kg   SpO2 98%   BMI 35.82 kg/m    PROVIDERS: Ann Maki, DO is PCP ([facility]). Last visit 07/25/18. She was seen for neck pain. Activity tolerance documented as able to walk everyday about an hour using cane and going to aquatic therapy 2x/week since 02/2018. Cyclobenzaprine prescribed. Dr. Florene Glen was aware of surgery plans.  - Pain management through [facility] [place] ([place]). Last visit 08/08/18. An MRI of the c-spine is planned to further evaluate her neck pain following  explantation of lumbar spinal cord stimulator.   LABS: Labs reviewed: Acceptable for surgery. (all labs ordered are listed, but only abnormal results are displayed)  Labs Reviewed  BASIC METABOLIC PANEL - Abnormal; Notable for the following components:      Result Value   Glucose, Bld 101 (*)    All other components within normal limits  SURGICAL PCR SCREEN  CBC    EKG: 07/25/18 ([facility]): SR. Low QRS voltage. Poor r wave progression, cannot rule out anterior infarct (age undetermined). ST/T abnormality, possible lateral ischemia or digitalis effect. Short QT interval. On my review, there is mild ST elevation in high lateral leads, but primarily in lead I which was noted on 01/09/15 tracing as well). She also has a mildly negative T wave in III. Artifact/interference from spinal cords stimulator noted. (Previous EKG has shown small Q waves in inferolateral leads 07/31/17, 01/09/15.)   CV: N/A   Past Medical History:  Diagnosis Date  . Arthritis   . Chronic back pain   . Depression   . GERD (gastroesophageal reflux disease)   . Hyperlipidemia 03/03/2014  . Neuropathy     Past Surgical History:  Procedure Laterality Date  . ABDOMINAL HYSTERECTOMY     partial-still has ovaries. Benign reason. Unsure about cervix   . ACHILLES TENDON SURGERY     2017   left  . BREAST SURGERY     nodule in breast removed-not cancer  . CHOLECYSTECTOMY    .  COLONOSCOPY  2018  . NEUROPLASTY / TRANSPOSITION MEDIAN NERVE AT CARPAL TUNNEL BILATERAL Bilateral   . SPINAL CORD STIMULATOR INSERTION  07/26/2017   Procedure: LUMBAR SPINAL CORD STIMULATOR INSERTION;  Surgeon: Clydell Hakim, MD;  Location: [facility];  Service: Neurosurgery;;    MEDICATIONS: . acetaminophen (TYLENOL) 500 MG tablet  . Black Cohosh 540 MG CAPS  . calcium carbonate (OS-CAL) 600 MG TABS tablet  . Cholecalciferol (VITAMIN D3) 2000 units TABS  . docusate sodium (COLACE) 100 MG capsule  . montelukast (SINGULAIR) 10 MG tablet   . NARCAN 4 MG/0.1ML LIQD nasal spray kit  . omeprazole (PRILOSEC) 20 MG capsule  . oxyCODONE-acetaminophen (PERCOCET) 10-325 MG tablet  . pregabalin (LYRICA) 50 MG capsule  . sucralfate (CARAFATE) 1 g tablet  . venlafaxine XR (EFFEXOR-XR) 37.5 MG 24 hr capsule   No current facility-administered medications for this encounter.     Myra Gianotti, PA-C Surgical Short Stay/Anesthesiology [facility] Phone [phone] [facility] Phone [phone] 08/14/2018 1:11 PM

## 2018-08-15 NOTE — Anesthesia Preprocedure Evaluation (Addendum)
Anesthesia Evaluation  Patient identified by MRN, date of birth, ID band Patient awake    Reviewed: Allergy & Precautions, NPO status , Patient's Chart, lab work & pertinent test results  Airway Mallampati: II  TM Distance: >3 FB Neck ROM: Full    Dental no notable dental hx.    Pulmonary neg pulmonary ROS,    Pulmonary exam normal breath sounds clear to auscultation       Cardiovascular negative cardio ROS   Rhythm:Regular Rate:Tachycardia  ECG: rate 103   Neuro/Psych  Headaches, PSYCHIATRIC DISORDERS Depression    GI/Hepatic GERD  Medicated,(+)     substance abuse  ,   Endo/Other  negative endocrine ROS  Renal/GU negative Renal ROS     Musculoskeletal  (+) narcotic dependentChronic back pain   Abdominal (+) + obese,   Peds  Hematology Hyperlipidemia   Anesthesia Other Findings Lumbar post-laminectomy syndrome  Reproductive/Obstetrics                           Anesthesia Physical Anesthesia Plan  ASA: III  Anesthesia Plan: General   Post-op Pain Management:    Induction: Intravenous  PONV Risk Score and Plan: 3 and Midazolam, Ondansetron, Dexamethasone and Treatment may vary due to age or medical condition  Airway Management Planned: Oral ETT  Additional Equipment:   Intra-op Plan:   Post-operative Plan: Extubation in OR  Informed Consent:   Dental advisory given  Plan Discussed with: CRNA  Anesthesia Plan Comments:        Anesthesia Quick Evaluation

## 2018-08-16 ENCOUNTER — Ambulatory Visit (HOSPITAL_COMMUNITY): Payer: Medicare Other

## 2018-08-16 ENCOUNTER — Ambulatory Visit (HOSPITAL_COMMUNITY): Payer: Medicare Other | Admitting: Certified Registered Nurse Anesthetist

## 2018-08-16 ENCOUNTER — Ambulatory Visit (HOSPITAL_COMMUNITY)
Admission: RE | Admit: 2018-08-16 | Discharge: 2018-08-16 | Disposition: A | Discharge status: Home / Self Care | Payer: Medicare Other | Attending: Anesthesiology | Admitting: Anesthesiology

## 2018-08-16 ENCOUNTER — Encounter (HOSPITAL_COMMUNITY)
Admission: RE | Disposition: A | Discharge status: Home / Self Care | Payer: Self-pay | Source: Home / Self Care | Attending: Anesthesiology

## 2018-08-16 ENCOUNTER — Ambulatory Visit (HOSPITAL_COMMUNITY): Payer: Medicare Other | Admitting: Physician Assistant

## 2018-08-16 DIAGNOSIS — Z462 Encounter for fitting and adjustment of other devices related to nervous system and special senses: Secondary | ICD-10-CM | POA: Diagnosis not present

## 2018-08-16 DIAGNOSIS — G894 Chronic pain syndrome: Secondary | ICD-10-CM | POA: Insufficient documentation

## 2018-08-16 DIAGNOSIS — Z885 Allergy status to narcotic agent status: Secondary | ICD-10-CM | POA: Insufficient documentation

## 2018-08-16 DIAGNOSIS — M5416 Radiculopathy, lumbar region: Secondary | ICD-10-CM | POA: Diagnosis not present

## 2018-08-16 DIAGNOSIS — F329 Major depressive disorder, single episode, unspecified: Secondary | ICD-10-CM | POA: Diagnosis not present

## 2018-08-16 DIAGNOSIS — Z79899 Other long term (current) drug therapy: Secondary | ICD-10-CM | POA: Insufficient documentation

## 2018-08-16 DIAGNOSIS — K219 Gastro-esophageal reflux disease without esophagitis: Secondary | ICD-10-CM | POA: Insufficient documentation

## 2018-08-16 DIAGNOSIS — Z419 Encounter for procedure for purposes other than remedying health state, unspecified: Secondary | ICD-10-CM

## 2018-08-16 DIAGNOSIS — Z888 Allergy status to other drugs, medicaments and biological substances status: Secondary | ICD-10-CM | POA: Insufficient documentation

## 2018-08-16 DIAGNOSIS — Z91013 Allergy to seafood: Secondary | ICD-10-CM | POA: Insufficient documentation

## 2018-08-16 HISTORY — PX: SPINAL CORD STIMULATOR REMOVAL: SHX5379

## 2018-08-16 SURGERY — LUMBAR SPINAL CORD STIMULATOR REMOVAL
Anesthesia: General | Site: Spine Lumbar

## 2018-08-16 MED ORDER — FENTANYL CITRATE (PF) 250 MCG/5ML IJ SOLN
INTRAMUSCULAR | Status: DC | PRN
  Administered 2018-08-16: 100 ug via INTRAVENOUS
  Administered 2018-08-16: 150 ug via INTRAVENOUS

## 2018-08-16 MED ORDER — CHLORHEXIDINE GLUCONATE CLOTH 2 % EX PADS: 6.0000 | MEDICATED_PAD | Freq: Once | CUTANEOUS | Status: DC

## 2018-08-16 MED ORDER — LACTATED RINGERS IV SOLN
INTRAVENOUS | Status: DC | PRN
  Administered 2018-08-16: 08:00:00 via INTRAVENOUS

## 2018-08-16 MED ORDER — BACITRACIN ZINC 500 UNIT/GM EX OINT
TOPICAL_OINTMENT | CUTANEOUS | Status: AC
  Filled 2018-08-16: qty 28.35

## 2018-08-16 MED ORDER — ONDANSETRON HCL 4 MG/2ML IJ SOLN
INTRAMUSCULAR | Status: DC | PRN
  Administered 2018-08-16: 4 mg via INTRAVENOUS

## 2018-08-16 MED ORDER — ROCURONIUM BROMIDE 50 MG/5ML IV SOSY
PREFILLED_SYRINGE | INTRAVENOUS | Status: AC
  Filled 2018-08-16: qty 5

## 2018-08-16 MED ORDER — CEFAZOLIN SODIUM-DEXTROSE 2-4 GM/100ML-% IV SOLN
2.0000 g | INTRAVENOUS | Status: AC
  Administered 2018-08-16: 2 g via INTRAVENOUS
  Filled 2018-08-16: qty 100

## 2018-08-16 MED ORDER — LIDOCAINE-EPINEPHRINE 1 %-1:100000 IJ SOLN
INTRAMUSCULAR | Status: DC | PRN
  Administered 2018-08-16: 16 mL

## 2018-08-16 MED ORDER — LIDOCAINE 2% (20 MG/ML) 5 ML SYRINGE
INTRAMUSCULAR | Status: AC
  Filled 2018-08-16: qty 5

## 2018-08-16 MED ORDER — LIDOCAINE-EPINEPHRINE 1 %-1:100000 IJ SOLN
INTRAMUSCULAR | Status: AC
  Filled 2018-08-16: qty 1

## 2018-08-16 MED ORDER — MIDAZOLAM HCL 2 MG/2ML IJ SOLN
INTRAMUSCULAR | Status: AC
  Filled 2018-08-16: qty 2

## 2018-08-16 MED ORDER — FENTANYL CITRATE (PF) 250 MCG/5ML IJ SOLN
INTRAMUSCULAR | Status: AC
  Filled 2018-08-16: qty 5

## 2018-08-16 MED ORDER — PROMETHAZINE HCL 25 MG/ML IJ SOLN
INTRAMUSCULAR | Status: AC
  Filled 2018-08-16: qty 1

## 2018-08-16 MED ORDER — BACITRACIN ZINC 500 UNIT/GM EX OINT
TOPICAL_OINTMENT | CUTANEOUS | Status: DC | PRN
  Administered 2018-08-16: 1 via TOPICAL

## 2018-08-16 MED ORDER — OXYCODONE HCL 5 MG PO TABS: 5.0000 mg | ORAL_TABLET | ORAL | 0 refills | Status: AC | PRN

## 2018-08-16 MED ORDER — PHENYLEPHRINE 40 MCG/ML (10ML) SYRINGE FOR IV PUSH (FOR BLOOD PRESSURE SUPPORT)
PREFILLED_SYRINGE | INTRAVENOUS | Status: AC
  Filled 2018-08-16: qty 10

## 2018-08-16 MED ORDER — ESMOLOL HCL 100 MG/10ML IV SOLN
INTRAVENOUS | Status: AC
  Filled 2018-08-16: qty 10

## 2018-08-16 MED ORDER — HYDROMORPHONE HCL 1 MG/ML IJ SOLN: 0.2500 mg | INTRAMUSCULAR | Status: DC | PRN

## 2018-08-16 MED ORDER — LIDOCAINE HCL (CARDIAC) PF 100 MG/5ML IV SOSY
PREFILLED_SYRINGE | INTRAVENOUS | Status: DC | PRN
  Administered 2018-08-16: 60 mg via INTRAVENOUS

## 2018-08-16 MED ORDER — DEXAMETHASONE SODIUM PHOSPHATE 10 MG/ML IJ SOLN
INTRAMUSCULAR | Status: DC | PRN
  Administered 2018-08-16: 10 mg via INTRAVENOUS

## 2018-08-16 MED ORDER — EPHEDRINE 5 MG/ML INJ
INTRAVENOUS | Status: AC
  Filled 2018-08-16: qty 10

## 2018-08-16 MED ORDER — ESMOLOL HCL 100 MG/10ML IV SOLN
INTRAVENOUS | Status: DC | PRN
  Administered 2018-08-16: 30 mg via INTRAVENOUS

## 2018-08-16 MED ORDER — PROMETHAZINE HCL 25 MG/ML IJ SOLN
6.2500 mg | INTRAMUSCULAR | Status: AC | PRN
  Administered 2018-08-16 (×2): 6.25 mg via INTRAVENOUS

## 2018-08-16 MED ORDER — MIDAZOLAM HCL 5 MG/5ML IJ SOLN
INTRAMUSCULAR | Status: DC | PRN
  Administered 2018-08-16: 2 mg via INTRAVENOUS

## 2018-08-16 MED ORDER — 0.9 % SODIUM CHLORIDE (POUR BTL) OPTIME
TOPICAL | Status: DC | PRN
  Administered 2018-08-16: 1000 mL

## 2018-08-16 MED ORDER — PROPOFOL 10 MG/ML IV BOLUS
INTRAVENOUS | Status: DC | PRN
  Administered 2018-08-16: 200 mg via INTRAVENOUS

## 2018-08-16 MED ORDER — ONDANSETRON HCL 4 MG/2ML IJ SOLN
INTRAMUSCULAR | Status: AC
  Filled 2018-08-16: qty 2

## 2018-08-16 MED ORDER — SUGAMMADEX SODIUM 200 MG/2ML IV SOLN
INTRAVENOUS | Status: DC | PRN
  Administered 2018-08-16: 200 mg via INTRAVENOUS

## 2018-08-16 MED ORDER — SODIUM CHLORIDE 0.9 % IV SOLN
INTRAVENOUS | Status: DC | PRN
  Administered 2018-08-16: 500 mL

## 2018-08-16 MED ORDER — SUCCINYLCHOLINE CHLORIDE 200 MG/10ML IV SOSY
PREFILLED_SYRINGE | INTRAVENOUS | Status: AC
  Filled 2018-08-16: qty 10

## 2018-08-16 MED ORDER — PROPOFOL 10 MG/ML IV BOLUS
INTRAVENOUS | Status: AC
  Filled 2018-08-16: qty 20

## 2018-08-16 MED ORDER — PHENYLEPHRINE HCL 10 MG/ML IJ SOLN
INTRAMUSCULAR | Status: DC | PRN
  Administered 2018-08-16 (×2): 80 ug via INTRAVENOUS

## 2018-08-16 MED ORDER — ROCURONIUM BROMIDE 100 MG/10ML IV SOLN
INTRAVENOUS | Status: DC | PRN
  Administered 2018-08-16: 50 mg via INTRAVENOUS

## 2018-08-16 MED ORDER — DEXAMETHASONE SODIUM PHOSPHATE 10 MG/ML IJ SOLN
INTRAMUSCULAR | Status: AC
  Filled 2018-08-16: qty 2

## 2018-08-16 SURGICAL SUPPLY — 39 items
ADH SKN CLS APL DERMABOND .7 (GAUZE/BANDAGES/DRESSINGS) ×1
BLADE CLIPPER SURG (BLADE) IMPLANT
CHLORAPREP W/TINT 26ML (MISCELLANEOUS) ×2 IMPLANT
COVER WAND RF STERILE (DRAPES) ×2 IMPLANT
DERMABOND ADVANCED (GAUZE/BANDAGES/DRESSINGS) ×1
DERMABOND ADVANCED .7 DNX12 (GAUZE/BANDAGES/DRESSINGS) ×1 IMPLANT
DRAPE LAPAROTOMY 100X72X124 (DRAPES) ×2 IMPLANT
DRSG OPSITE POSTOP 3X4 (GAUZE/BANDAGES/DRESSINGS) ×4 IMPLANT
ELECT REM PT RETURN 9FT ADLT (ELECTROSURGICAL) ×2
ELECTRODE REM PT RTRN 9FT ADLT (ELECTROSURGICAL) ×1 IMPLANT
GLOVE BIOGEL PI IND STRL 6.5 (GLOVE) IMPLANT
GLOVE BIOGEL PI IND STRL 7.5 (GLOVE) ×1 IMPLANT
GLOVE BIOGEL PI INDICATOR 6.5 (GLOVE) ×1
GLOVE BIOGEL PI INDICATOR 7.5 (GLOVE) ×1
GLOVE ECLIPSE 7.5 STRL STRAW (GLOVE) ×1 IMPLANT
GLOVE EXAM NITRILE LRG STRL (GLOVE) IMPLANT
GLOVE EXAM NITRILE XL STR (GLOVE) IMPLANT
GLOVE EXAM NITRILE XS STR PU (GLOVE) IMPLANT
GLOVE SURG SS PI 6.5 STRL IVOR (GLOVE) ×3 IMPLANT
GLOVE SURG SS PI 7.5 STRL IVOR (GLOVE) ×1 IMPLANT
GOWN STRL REUS W/ TWL LRG LVL3 (GOWN DISPOSABLE) IMPLANT
GOWN STRL REUS W/TWL LRG LVL3 (GOWN DISPOSABLE) ×6
KIT BASIN OR (CUSTOM PROCEDURE TRAY) ×2 IMPLANT
KIT TURNOVER KIT B (KITS) ×2 IMPLANT
NDL HYPO 25X1 1.5 SAFETY (NEEDLE) ×1 IMPLANT
NEEDLE HYPO 25X1 1.5 SAFETY (NEEDLE) ×2 IMPLANT
NS IRRIG 1000ML POUR BTL (IV SOLUTION) ×2 IMPLANT
PACK LAMINECTOMY NEURO (CUSTOM PROCEDURE TRAY) ×2 IMPLANT
PAD ARMBOARD 7.5X6 YLW CONV (MISCELLANEOUS) ×4 IMPLANT
SPONGE LAP 4X18 RFD (DISPOSABLE) IMPLANT
SUT MNCRL AB 4-0 PS2 18 (SUTURE) ×1 IMPLANT
SUT SILK 2 0 PERMA HAND 18 BK (SUTURE) IMPLANT
SUT VIC AB 0 CT1 18XCR BRD8 (SUTURE) IMPLANT
SUT VIC AB 0 CT1 8-18 (SUTURE) ×6
SUT VIC AB 2-0 CP2 18 (SUTURE) ×2 IMPLANT
TOWEL GREEN STERILE (TOWEL DISPOSABLE) ×2 IMPLANT
TOWEL GREEN STERILE FF (TOWEL DISPOSABLE) ×2 IMPLANT
WATER STERILE IRR 1000ML POUR (IV SOLUTION) ×2 IMPLANT
YANKAUER SUCT BULB TIP NO VENT (SUCTIONS) ×2 IMPLANT

## 2018-08-16 NOTE — Anesthesia Postprocedure Evaluation (Signed)
Anesthesia Post Note  Patient: Agricultural consultant  Procedure(s) Performed: LUMBAR SPINAL CORD STIMULATOR REMOVAL (N/A Spine Lumbar)     Patient location during evaluation: PACU Anesthesia Type: General Level of consciousness: awake and alert Pain management: pain level controlled Vital Signs Assessment: post-procedure vital signs reviewed and stable Respiratory status: spontaneous breathing, nonlabored ventilation, respiratory function stable and patient connected to nasal cannula oxygen Cardiovascular status: blood pressure returned to baseline and stable Postop Assessment: no apparent nausea or vomiting Anesthetic complications: no    Last Vitals:  Vitals:   08/16/18 1116 08/16/18 1118  BP:    Pulse:    Resp:    Temp:    SpO2: 97% 95%    Last Pain:  Vitals:   08/16/18 1007  TempSrc:   PainSc: 3                  Virdie Penning P Pascual Mantel

## 2018-08-16 NOTE — H&P (Signed)
[name] is an 53 y.o. female.   Chief Complaint: "SCS isn't working" HPI: Chronic back pain, with limited benefit from medication management or intervention, was found to be a good candidate for spinal cord stimulator placement, and underwent that procedure a little over a year ago.  Other than the immediate postoperative period she is undergone no reprogramming or alteration of the device itself, but reports that it is not working.  She further reports that the generator site is painful.  Sh refuses any effort to improve the performance of the device, and is requesting removal.  Past Medical History:  Diagnosis Date  . Arthritis   . Chronic back pain   . Depression   . GERD (gastroesophageal reflux disease)   . Hyperlipidemia 03/03/2014  . Neuropathy     Past Surgical History:  Procedure Laterality Date  . ABDOMINAL HYSTERECTOMY     partial-still has ovaries. Benign reason. Unsure about cervix   . ACHILLES TENDON SURGERY     2017   left  . BREAST SURGERY     nodule in breast removed-not cancer  . CHOLECYSTECTOMY    . COLONOSCOPY  2018  . NEUROPLASTY / TRANSPOSITION MEDIAN NERVE AT CARPAL TUNNEL BILATERAL Bilateral   . SPINAL CORD STIMULATOR INSERTION  07/26/2017   Procedure: LUMBAR SPINAL CORD STIMULATOR INSERTION;  Surgeon: Clydell Hakim, MD;  Location: [facility] OR;  Service: Neurosurgery;;    Family History  Problem Relation Age of Onset  . Hypertension Mother   . Hypertension Father   . Diabetes Father   . Cancer Paternal Grandmother        unsure  . Cancer Paternal Grandfather        prostate  . Diabetes type II Daughter   . Healthy Son    Social History:  reports that she has never smoked. She has never used smokeless tobacco. She reports that she does not drink alcohol or use drugs.  Allergies:  Allergies  Allergen Reactions  . Darvocet [Propoxyphene N-Acetaminophen] Nausea And Vomiting  . Percocet [Oxycodone-Acetaminophen] Nausea And Vomiting    Takes nausea  medication with this medication  . Shrimp [Shellfish Allergy] Hives and Itching  . Gabapentin Rash  . Latex Itching and Rash    Medications Prior to Admission  Medication Sig Dispense Refill  . acetaminophen (TYLENOL) 500 MG tablet Take 1,000 mg by mouth every 6 (six) hours as needed for mild pain, moderate pain, fever or headache.    . Black Cohosh 540 MG CAPS Take 1 capsule by mouth daily.    . calcium carbonate (OS-CAL) 600 MG TABS tablet Take 600 mg by mouth at bedtime.     . Cholecalciferol (VITAMIN D3) 2000 units TABS Take 2,000 Units by mouth at bedtime.    . docusate sodium (COLACE) 100 MG capsule Take 100 mg by mouth daily.    . montelukast (SINGULAIR) 10 MG tablet Take 1 tablet (10 mg total) by mouth at bedtime. (Patient taking differently: Take 10 mg by mouth daily as needed (for seasonal allergies.). ) 30 tablet 11  . omeprazole (PRILOSEC) 20 MG capsule Take 20 mg by mouth every morning.    Marland Kitchen oxyCODONE-acetaminophen (PERCOCET) 10-325 MG tablet Take 1 tablet by mouth every 4 (four) hours as needed for pain. 40 tablet 0  . pregabalin (LYRICA) 50 MG capsule Take 50 mg by mouth 3 (three) times daily. Morning, midday, bedtime    . sucralfate (CARAFATE) 1 g tablet Take 1 g by mouth daily as needed (nausea).    Marland Kitchen  tiZANidine (ZANAFLEX) 2 MG tablet Take by mouth every 6 (six) hours as needed for muscle spasms.    Marland Kitchen venlafaxine XR (EFFEXOR-XR) 37.5 MG 24 hr capsule Take 37.5 mg by mouth daily with breakfast.    . NARCAN 4 MG/0.1ML LIQD nasal spray kit Place 4 mg into the nose daily as needed (opiod).   0    No results found for this or any previous visit (from the past 48 hour(s)). No results found.  Review of Systems  Constitutional: Negative.   HENT: Negative.   Eyes: Negative.   Respiratory: Negative.   Cardiovascular: Negative.   Gastrointestinal: Negative.   Genitourinary: Negative.   Musculoskeletal: Positive for back pain. Negative for falls and myalgias.  Skin: Negative.    Neurological: Negative.   Endo/Heme/Allergies: Negative.   Psychiatric/Behavioral: Negative.     Blood pressure (!) 135/93, pulse (!) 103, temperature (!) 97.2 F (36.2 C), temperature source Oral, resp. rate 20, SpO2 96 %. Physical Exam  Constitutional: She appears well-developed and well-nourished.  HENT:  Head: Normocephalic and atraumatic.  Eyes: Pupils are equal, round, and reactive to light. EOM are normal.  Neck: Normal range of motion.  Cardiovascular: Normal rate.  Respiratory: Effort normal.  Musculoskeletal: Normal range of motion.  Skin: Skin is warm and dry.  Psychiatric: She has a normal mood and affect. Thought content normal.     Assessment/Plan Chronic pain syndrome Plan: SCS removal  Bonna Gains, MD 08/16/2018, 6:45 AM

## 2018-08-16 NOTE — Op Note (Signed)
PREOP DX: 1) lumbago  2) lumbar radiculopathy  3) chronic pain  POSTOP DX: same as preop PROCEDURES PERFORMED:1) intraop fluoro 2) removal of 2 16 contact [employer] leads 3) removal of Spectra SCS generator  SURGEON:Myrtle Haller  ASSISTANT: NONE  ANESTHESIA:GETA EBL: <20cc  DESCRIPTION OF PROCEDURE: After a discussion of risks, benefits and alternatives, informed consent was obtained. The patient was taken to the OR,general anesthesia induced by the anesthesia team,turned prone onto a Jackson table, all pressure points padded, SCD's placed, and an adequate plane of anesthesia induced. A timeout was taken to verify the correct patient, position, personnel, availability of appropriate equipment, and administration of perioperative antibiotics.   The thoracic and lumbar areas were widely prepped with chloraprep and draped into a sterile field. Fluoroscopy was used to identify anchor positions. Each previous incision was infiltrated with marcaine, and the generator site entered sharply with a 10 blade and subsequent dissection with the metzenbaum scissors. Once the generator was liberated from the pocket, the leads were cut above their bifurcations and the generator passed off the field.  Similarly, the previous lead incision was opened directly over the anchors and dissected sharply with the bovie down to the anchors. The anchors were freed and the leads were easily removed in toto.   Both incisions were copiously irrigated with bacitracin-containing irrigation. The lumbar incision was closed in 2 deep layers of interrupted 1-0 vicryl and the skin closed with staples.  The pocket incision was closed with a deeper layer of 1-0 vicryl interrupted sutures in order to decrease pocket dead space, and then the incision closed with a layer of 1-0 vicryl and the skin closed with staples. Sterile dressings were applied. Needle, sponge, and instrument  counts were correct x2 at the end of the case.  The patient was then carefully awakened from anesthesia, turned supine, an abdominal binder placed, and the patient taken to the recovery room where he underwent complex spinal cord stimulator programming.  COMPLICATIONS: NONE  CONDITION: Stable throughout the course of the procedure and immediately afterward  DISPOSITION: discharge to home. Discussed care with the patient and spouse. Followup in clinic will be scheduled in 10-14 days.

## 2018-08-16 NOTE — Progress Notes (Signed)
Patient's 02 saturation stable. Patient ready for discharge.

## 2018-08-16 NOTE — Progress Notes (Signed)
Notified Dr. Bradley Ferris that patient's O2 Saturation dropped to 89% on RA and that patient has received 12.5 mg phenergan. Dr. Bradley Ferris said to keep patient for a little longer until O2 Sats are WNL.

## 2018-08-16 NOTE — Transfer of Care (Signed)
Immediate Anesthesia Transfer of Care Note  Patient: [name]  Procedure(s) Performed: LUMBAR SPINAL CORD STIMULATOR REMOVAL (N/A Spine Lumbar)  Patient Location: PACU  Anesthesia Type:General  Level of Consciousness: awake, alert , oriented and patient cooperative  Airway & Oxygen Therapy: Patient Spontanous Breathing and Patient connected to face mask oxygen  Post-op Assessment: Report given to RN and Post -op Vital signs reviewed and stable  Post vital signs: Reviewed and stable  Last Vitals:  Vitals Value Taken Time  BP 129/95 08/16/2018  8:48 AM  Temp    Pulse 98 08/16/2018  8:50 AM  Resp 19 08/16/2018  8:50 AM  SpO2 98 % 08/16/2018  8:50 AM  Vitals shown include unvalidated device data.  Last Pain:  Vitals:   08/16/18 0630  TempSrc:   PainSc: 8          Complications: No apparent anesthesia complications

## 2018-08-16 NOTE — Anesthesia Procedure Notes (Signed)
Procedure Name: Intubation Date/Time: 08/16/2018 7:43 AM Performed by: Shirlyn Goltz, CRNA Pre-anesthesia Checklist: Patient identified, Emergency Drugs available, Suction available and Patient being monitored Patient Re-evaluated:Patient Re-evaluated prior to induction Oxygen Delivery Method: Circle system utilized Preoxygenation: Pre-oxygenation with 100% oxygen Induction Type: IV induction Ventilation: Mask ventilation without difficulty Laryngoscope Size: Mac and 4 Grade View: Grade II Tube type: Oral Tube size: 7.0 mm Number of attempts: 1 Airway Equipment and Method: Stylet Placement Confirmation: ETT inserted through vocal cords under direct vision,  positive ETCO2 and breath sounds checked- equal and bilateral Secured at: 21 cm Tube secured with: Tape Dental Injury: Teeth and Oropharynx as per pre-operative assessment

## 2018-08-16 NOTE — Discharge Instructions (Addendum)
Dr. Harkins Post-Op Orders ° °• Ice Pack - 20 minutes on (in a pillow case), and 20 minutes off. Wear the ice pack UNDER the binder. °• Follow up in office, they will call you for an appointment in 10 days to 2 weeks. °• Increase activity gradually.   °• Advance diet slowly as tolerated. °• Dressing care:  Keep dressing dry for 3 days, and on Post-op day 4, may shower. °• Call for fever, drainage, and redness. °• No swimming or bathing in a bathtub (do not get into standing water).' °

## 2018-08-19 ENCOUNTER — Encounter (HOSPITAL_COMMUNITY): Payer: Self-pay | Admitting: Anesthesiology

## 2018-10-14 ENCOUNTER — Emergency Department (HOSPITAL_COMMUNITY)
Admission: EM | Admit: 2018-10-14 | Discharge: 2018-10-15 | Disposition: A | Discharge status: Home / Self Care | Payer: Medicare Other | Attending: Emergency Medicine | Admitting: Emergency Medicine

## 2018-10-14 ENCOUNTER — Other Ambulatory Visit: Payer: Self-pay

## 2018-10-14 ENCOUNTER — Encounter (HOSPITAL_COMMUNITY): Payer: Self-pay

## 2018-10-14 DIAGNOSIS — R1033 Periumbilical pain: Secondary | ICD-10-CM | POA: Insufficient documentation

## 2018-10-14 DIAGNOSIS — Z91040 Latex allergy status: Secondary | ICD-10-CM | POA: Insufficient documentation

## 2018-10-14 DIAGNOSIS — Z79899 Other long term (current) drug therapy: Secondary | ICD-10-CM | POA: Insufficient documentation

## 2018-10-14 DIAGNOSIS — R112 Nausea with vomiting, unspecified: Secondary | ICD-10-CM | POA: Insufficient documentation

## 2018-10-14 LAB — COMPREHENSIVE METABOLIC PANEL
ALT: 31 U/L (ref 0–44)
AST: 28 U/L (ref 15–41)
Albumin: 4.7 g/dL (ref 3.5–5.0)
Alkaline Phosphatase: 55 U/L (ref 38–126)
Anion gap: 10 (ref 5–15)
BUN: 19 mg/dL (ref 6–20)
CO2: 23 mmol/L (ref 22–32)
Calcium: 10.3 mg/dL (ref 8.9–10.3)
Chloride: 108 mmol/L (ref 98–111)
Creatinine, Ser: 0.86 mg/dL (ref 0.44–1.00)
GFR calc Af Amer: >60 mL/min (ref >60)
GFR calc non Af Amer: >60 mL/min (ref >60)
Glucose, Bld: 113 mg/dL — ABNORMAL HIGH (ref 70–99)
Potassium: 3.5 mmol/L (ref 3.5–5.1)
Sodium: 141 mmol/L (ref 135–145)
Total Bilirubin: 0.8 mg/dL (ref 0.3–1.2)
Total Protein: 8.1 g/dL (ref 6.5–8.1)

## 2018-10-14 LAB — CBC
HCT: 44.5 % (ref 36.0–46.0)
Hemoglobin: 13.8 g/dL (ref 12.0–15.0)
MCH: 27.1 pg (ref 26.0–34.0)
MCHC: 31 g/dL (ref 30.0–36.0)
MCV: 87.3 fL (ref 80.0–100.0)
Platelets: 287 10*3/uL (ref 150–400)
RBC: 5.1 MIL/uL (ref 3.87–5.11)
RDW: 13.4 % (ref 11.5–15.5)
WBC: 4.4 10*3/uL (ref 4.0–10.5)
nRBC: 0 % (ref 0.0–0.2)

## 2018-10-14 LAB — URINALYSIS, ROUTINE W REFLEX MICROSCOPIC
Bilirubin Urine: NEGATIVE
Glucose, UA: NEGATIVE mg/dL
Hgb urine dipstick: NEGATIVE
Ketones, ur: 5 mg/dL — AB
Leukocytes,Ua: NEGATIVE
Nitrite: NEGATIVE
Protein, ur: 100 mg/dL — AB
Specific Gravity, Urine: 1.027 (ref 1.005–1.030)
pH: 5 (ref 5.0–8.0)

## 2018-10-14 LAB — LIPASE, BLOOD: Lipase: 20 U/L (ref 11–51)

## 2018-10-14 LAB — I-STAT BETA HCG BLOOD, ED (MC, WL, AP ONLY): I-stat hCG, quantitative: <5 m[IU]/mL (ref <5)

## 2018-10-14 MED ORDER — SODIUM CHLORIDE 0.9% FLUSH
3.0000 mL | Freq: Once | INTRAVENOUS | Status: AC
  Administered 2018-10-15: 3 mL via INTRAVENOUS

## 2018-10-14 MED ORDER — ONDANSETRON 4 MG PO TBDP
4.0000 mg | ORAL_TABLET | Freq: Once | ORAL | Status: AC
  Administered 2018-10-14: 4 mg via ORAL
  Filled 2018-10-14: qty 1

## 2018-10-14 NOTE — ED Triage Notes (Signed)
Pt arrives POV for eval of centralized abd pain onset 2 days PTA. Pt reports N/V/D onset yesterday. Pt denies fever, chills, HA.

## 2018-10-15 ENCOUNTER — Encounter (HOSPITAL_COMMUNITY): Payer: Self-pay | Admitting: Radiology

## 2018-10-15 ENCOUNTER — Emergency Department (HOSPITAL_COMMUNITY): Payer: Medicare Other

## 2018-10-15 DIAGNOSIS — R1033 Periumbilical pain: Secondary | ICD-10-CM | POA: Diagnosis not present

## 2018-10-15 MED ORDER — LIDOCAINE VISCOUS HCL 2 % MT SOLN
15.0000 mL | Freq: Once | OROMUCOSAL | Status: AC
  Administered 2018-10-15: 15 mL via ORAL
  Filled 2018-10-15: qty 15

## 2018-10-15 MED ORDER — HYOSCYAMINE SULFATE 0.125 MG SL SUBL
0.2500 mg | SUBLINGUAL_TABLET | Freq: Once | SUBLINGUAL | Status: AC
  Administered 2018-10-15: 0.25 mg via SUBLINGUAL
  Filled 2018-10-15: qty 2

## 2018-10-15 MED ORDER — MORPHINE SULFATE (PF) 4 MG/ML IV SOLN
4.0000 mg | Freq: Once | INTRAVENOUS | Status: AC
  Administered 2018-10-15: 4 mg via INTRAVENOUS
  Filled 2018-10-15: qty 1

## 2018-10-15 MED ORDER — DIPHENHYDRAMINE HCL 50 MG/ML IJ SOLN
25.0000 mg | Freq: Once | INTRAMUSCULAR | Status: AC
  Administered 2018-10-15: 25 mg via INTRAVENOUS
  Filled 2018-10-15: qty 1

## 2018-10-15 MED ORDER — SODIUM CHLORIDE 0.9 % IV BOLUS
500.0000 mL | Freq: Once | INTRAVENOUS | Status: AC
  Administered 2018-10-15: 500 mL via INTRAVENOUS

## 2018-10-15 MED ORDER — METOCLOPRAMIDE HCL 5 MG/ML IJ SOLN
10.0000 mg | Freq: Once | INTRAMUSCULAR | Status: AC
  Administered 2018-10-15: 10 mg via INTRAVENOUS
  Filled 2018-10-15: qty 2

## 2018-10-15 MED ORDER — IOHEXOL 300 MG/ML  SOLN
100.0000 mL | Freq: Once | INTRAMUSCULAR | Status: AC | PRN
  Administered 2018-10-15: 100 mL via INTRAVENOUS

## 2018-10-15 MED ORDER — ALUM & MAG HYDROXIDE-SIMETH 200-200-20 MG/5ML PO SUSP
30.0000 mL | Freq: Once | ORAL | Status: AC
  Administered 2018-10-15: 30 mL via ORAL
  Filled 2018-10-15: qty 30

## 2018-10-15 MED ORDER — HYOSCYAMINE SULFATE 0.125 MG SL SUBL: 0.1250 mg | SUBLINGUAL_TABLET | SUBLINGUAL | 0 refills | Status: DC | PRN

## 2018-10-15 NOTE — ED Notes (Signed)
Pt discharged from ED; instructions provided and scripts given; Pt encouraged to return to ED if symptoms worsen and to f/u with PCP; Pt verbalized understanding of all instructions 

## 2018-10-15 NOTE — ED Provider Notes (Signed)
[facility] EMERGENCY DEPARTMENT Provider Note   CSN: [id number] Arrival date & time: 10/14/18  1907    History   Chief Complaint Chief Complaint  Patient presents with  . Abdominal Pain    HPI [name] is a 53 y.o. female.     Patient presents to the emergency department for evaluation of abdominal pain.  Patient reports that she started to have pain 2 days ago.  Pain has been persistent in the central portion of her abdomen.  She has had associated nausea and vomiting.  She has not had any diarrhea.  She denies hematemesis, rectal bleeding, melena.  She has not had any fever.  She does complain of a mild headache.  No cough, chest congestion or upper respiratory infection symptoms.  She denies sick contacts.     Past Medical History:  Diagnosis Date  . Arthritis   . Chronic back pain   . Depression   . GERD (gastroesophageal reflux disease)   . Hyperlipidemia 03/03/2014  . Neuropathy     Patient Active Problem List   Diagnosis Date Noted  . Chronic foot pain 08/26/2015  . Night sweats 02/04/2015  . Atypical chest pain 02/04/2015  . Myalgia 04/10/2014  . Headache(784.0) 04/03/2014  . Insomnia 04/03/2014  . Obesity 04/03/2014  . Hyperlipidemia 03/03/2014  . Arthritis 03/03/2014  . Seasonal allergies 03/03/2014    Past Surgical History:  Procedure Laterality Date  . ABDOMINAL HYSTERECTOMY     partial-still has ovaries. Benign reason. Unsure about cervix   . ACHILLES TENDON SURGERY     2017   left  . BREAST SURGERY     nodule in breast removed-not cancer  . CHOLECYSTECTOMY    . COLONOSCOPY  2018  . NEUROPLASTY / TRANSPOSITION MEDIAN NERVE AT CARPAL TUNNEL BILATERAL Bilateral   . SPINAL CORD STIMULATOR INSERTION  07/26/2017   Procedure: LUMBAR SPINAL CORD STIMULATOR INSERTION;  Surgeon: Clydell Hakim, MD;  Location: [facility];  Service: Neurosurgery;;  . SPINAL CORD STIMULATOR REMOVAL N/A 08/16/2018   Procedure: LUMBAR SPINAL CORD STIMULATOR  REMOVAL;  Surgeon: Clydell Hakim, MD;  Location: [place];  Service: Neurosurgery;  Laterality: N/A;     OB History   No obstetric history on file.      Home Medications    Prior to Admission medications   Medication Sig Start Date End Date Taking? Authorizing Provider  acetaminophen (TYLENOL) 500 MG tablet Take 1,000 mg by mouth every 6 (six) hours as needed for mild pain, moderate pain, fever or headache.    [provider]  Black Cohosh 540 MG CAPS Take 1 capsule by mouth daily.    [provider]  calcium carbonate (OS-CAL) 600 MG TABS tablet Take 600 mg by mouth at bedtime.     [provider]  Cholecalciferol (VITAMIN D3) 2000 units TABS Take 2,000 Units by mouth at bedtime.    [provider]  docusate sodium (COLACE) 100 MG capsule Take 100 mg by mouth daily.    [provider]  hyoscyamine (LEVSIN SL) 0.125 MG SL tablet Place 1 tablet (0.125 mg total) under the tongue every 4 (four) hours as needed for cramping (abdominal pain). 10/15/18   Orpah Greek, MD  montelukast (SINGULAIR) 10 MG tablet Take 1 tablet (10 mg total) by mouth at bedtime. Patient taking differently: Take 10 mg by mouth daily as needed (for seasonal allergies.).  03/03/14   Marin Olp, MD  [facility] 4 MG/0.1ML LIQD nasal spray kit  Place 4 mg into the nose daily as needed (opiod).  05/10/17   [provider]  omeprazole (PRILOSEC) 20 MG capsule Take 20 mg by mouth every morning.    [provider]  oxyCODONE-acetaminophen (PERCOCET) 10-325 MG tablet Take 1 tablet by mouth every 4 (four) hours as needed for pain. 07/26/17   Clydell Hakim, MD  pregabalin (LYRICA) 50 MG capsule Take 50 mg by mouth 3 (three) times daily. Morning, midday, bedtime    [provider]  sucralfate (CARAFATE) 1 g tablet Take 1 g by mouth daily as needed (nausea).    [provider]  tiZANidine (ZANAFLEX) 2 MG tablet Take by mouth every 6 (six) hours as  needed for muscle spasms.    [provider]  venlafaxine XR (EFFEXOR-XR) 37.5 MG 24 hr capsule Take 37.5 mg by mouth daily with breakfast.    [provider]    Family History Family History  Problem Relation Age of Onset  . Hypertension Mother   . Hypertension Father   . Diabetes Father   . Cancer Paternal Grandmother        unsure  . Cancer Paternal Grandfather        prostate  . Diabetes type II Daughter   . Healthy Son     Social History Social History   Tobacco Use  . Smoking status: Never Smoker  . Smokeless tobacco: Never Used  Substance Use Topics  . Alcohol use: No    Alcohol/week: 0.0 standard drinks  . Drug use: No     Allergies   Darvocet [propoxyphene n-acetaminophen]; Percocet [oxycodone-acetaminophen]; Shrimp [shellfish allergy]; Gabapentin; and Latex   Review of Systems Review of Systems  Gastrointestinal: Positive for abdominal pain, nausea and vomiting.  All other systems reviewed and are negative.    Physical Exam Updated Vital Signs BP 136/80   Pulse 97   Temp 99 F (37.2 C) (Oral)   Resp (!) 22   Ht '5\' 3"'  (1.6 m)   Wt 91.6 kg   SpO2 97%   BMI 35.78 kg/m   Physical Exam Vitals signs and nursing note reviewed.  Constitutional:      General: She is not in acute distress.    Appearance: Normal appearance. She is well-developed.  HENT:     Head: Normocephalic and atraumatic.     Right Ear: Hearing normal.     Left Ear: Hearing normal.     Nose: Nose normal.  Eyes:     Conjunctiva/sclera: Conjunctivae normal.     Pupils: Pupils are equal, round, and reactive to light.  Neck:     Musculoskeletal: Normal range of motion and neck supple.  Cardiovascular:     Rate and Rhythm: Regular rhythm.     Heart sounds: S1 normal and S2 normal. No murmur. No friction rub. No gallop.   Pulmonary:     Effort: Pulmonary effort is normal. No respiratory distress.     Breath sounds: Normal breath sounds.  Chest:     Chest  wall: No tenderness.  Abdominal:     General: Bowel sounds are normal.     Palpations: Abdomen is soft.     Tenderness: There is abdominal tenderness in the epigastric area and periumbilical area. There is no guarding or rebound. Negative signs include Murphy's sign and McBurney's sign.     Hernia: No hernia is present.  Musculoskeletal: Normal range of motion.  Skin:    General: Skin is warm and dry.  Findings: No rash.  Neurological:     Mental Status: She is alert and oriented to person, place, and time.     GCS: GCS eye subscore is 4. GCS verbal subscore is 5. GCS motor subscore is 6.     Cranial Nerves: No cranial nerve deficit.     Sensory: No sensory deficit.     Coordination: Coordination normal.  Psychiatric:        Speech: Speech normal.        Behavior: Behavior normal.        Thought Content: Thought content normal.      ED Treatments / Results  Labs (all labs ordered are listed, but only abnormal results are displayed) Labs Reviewed  COMPREHENSIVE METABOLIC PANEL - Abnormal; Notable for the following components:      Result Value   Glucose, Bld 113 (*)    All other components within normal limits  URINALYSIS, ROUTINE W REFLEX MICROSCOPIC - Abnormal; Notable for the following components:   APPearance HAZY (*)    Ketones, ur 5 (*)    Protein, ur 100 (*)    Bacteria, UA FEW (*)    All other components within normal limits  LIPASE, BLOOD  CBC  I-STAT BETA HCG BLOOD, ED ([facility], [facility], [facility] ONLY)    EKG None  Radiology Ct Abdomen Pelvis W Contrast  Result Date: 10/15/2018 CLINICAL DATA:  Central abdominal pain for 2 days EXAM: CT ABDOMEN AND PELVIS WITH CONTRAST TECHNIQUE: Multidetector CT imaging of the abdomen and pelvis was performed using the standard protocol following bolus administration of intravenous contrast. CONTRAST:  159m OMNIPAQUE IOHEXOL 300 MG/ML  SOLN COMPARISON:  None. FINDINGS: Lower chest: Mild dependent atelectatic changes are noted. No focal  confluent infiltrate is seen. Hepatobiliary: Fatty infiltration of the liver is noted. The gallbladder has been surgically removed. Pancreas: Unremarkable. No pancreatic ductal dilatation or surrounding inflammatory changes. Spleen: Normal in size without focal abnormality. Adrenals/Urinary Tract: Adrenal glands are within normal limits. Kidneys demonstrate a normal enhancement pattern bilaterally. No renal calculi or obstructive changes are seen. Delayed images demonstrate normal excretion of contrast material. The bladder is well distended. Stomach/Bowel: The appendix is within normal limits. No obstructive or inflammatory changes of the colon or small bowel are seen. The stomach is within normal limits. Vascular/Lymphatic: No significant vascular findings are present. No enlarged abdominal or pelvic lymph nodes. Reproductive: Status post hysterectomy. No adnexal masses. Other: No abdominal wall hernia or abnormality. No abdominopelvic ascites. Musculoskeletal: Bony structures demonstrate degenerative change of the lumbar spine. No acute abnormality is noted. IMPRESSION: Chronic changes without acute abnormality. Electronically Signed   By: MInez CatalinaM.D.   On: 10/15/2018 06:30    Procedures Procedures (including critical care time)  Medications Ordered in ED Medications  morphine 4 MG/ML injection 4 mg (has no administration in time range)  metoCLOPramide (REGLAN) injection 10 mg (has no administration in time range)  diphenhydrAMINE (BENADRYL) injection 25 mg (has no administration in time range)  alum & mag hydroxide-simeth (MAALOX/MYLANTA) 200-200-20 MG/5ML suspension 30 mL (has no administration in time range)    And  lidocaine (XYLOCAINE) 2 % viscous mouth solution 15 mL (has no administration in time range)  hyoscyamine (LEVSIN SL) SL tablet 0.25 mg (has no administration in time range)  sodium chloride flush (NS) 0.9 % injection 3 mL (3 mLs Intravenous Given 10/15/18 0549)  ondansetron  (ZOFRAN-ODT) disintegrating tablet 4 mg (4 mg Oral Given 10/14/18 2119)  sodium chloride 0.9 % bolus 500  mL (500 mLs Intravenous New Bag/Given 10/15/18 0645)  iohexol (OMNIPAQUE) 300 MG/ML solution 100 mL (100 mLs Intravenous Contrast Given 10/15/18 0606)     Initial Impression / Assessment and Plan / ED Course  I have reviewed the triage vital signs and the nursing notes.  Pertinent labs & imaging results that were available during my care of the patient were reviewed by me and considered in my medical decision making (see chart for details).        Patient presents to the emergency department for evaluation of abdominal pain with nausea and vomiting.  Symptoms began 2 days ago.  Patient has had persistent pain in the central portion of her abdomen.  She has not had hematemesis, rectal bleeding or melena.  Abdominal exam revealed tenderness but no guarding or rebound, a fairly benign exam.  Lab work was unremarkable.  Patient did not have improvement with IV fluids and Zofran.  She therefore underwent CT scan for further evaluation.  No acute abnormality was noted.  Patient given additional analgesia and antiemetics, will be appropriate for discharge and outpatient management, follow-up with primary care.  Final Clinical Impressions(s) / ED Diagnoses   Final diagnoses:  Periumbilical abdominal pain    ED Discharge Orders         Ordered    hyoscyamine (LEVSIN SL) 0.125 MG SL tablet  Every 4 hours PRN     10/15/18 0705           Orpah Greek, MD 10/15/18 [phone]

## 2019-09-13 IMAGING — CR DG LUMBAR SPINE 1V
1 series · 1 of 1 positions shown · non-contrast
Comparison: None.

CLINICAL DATA: Intraoperative localization

EXAM:
LUMBAR SPINE - 1 VIEW

[lateral]
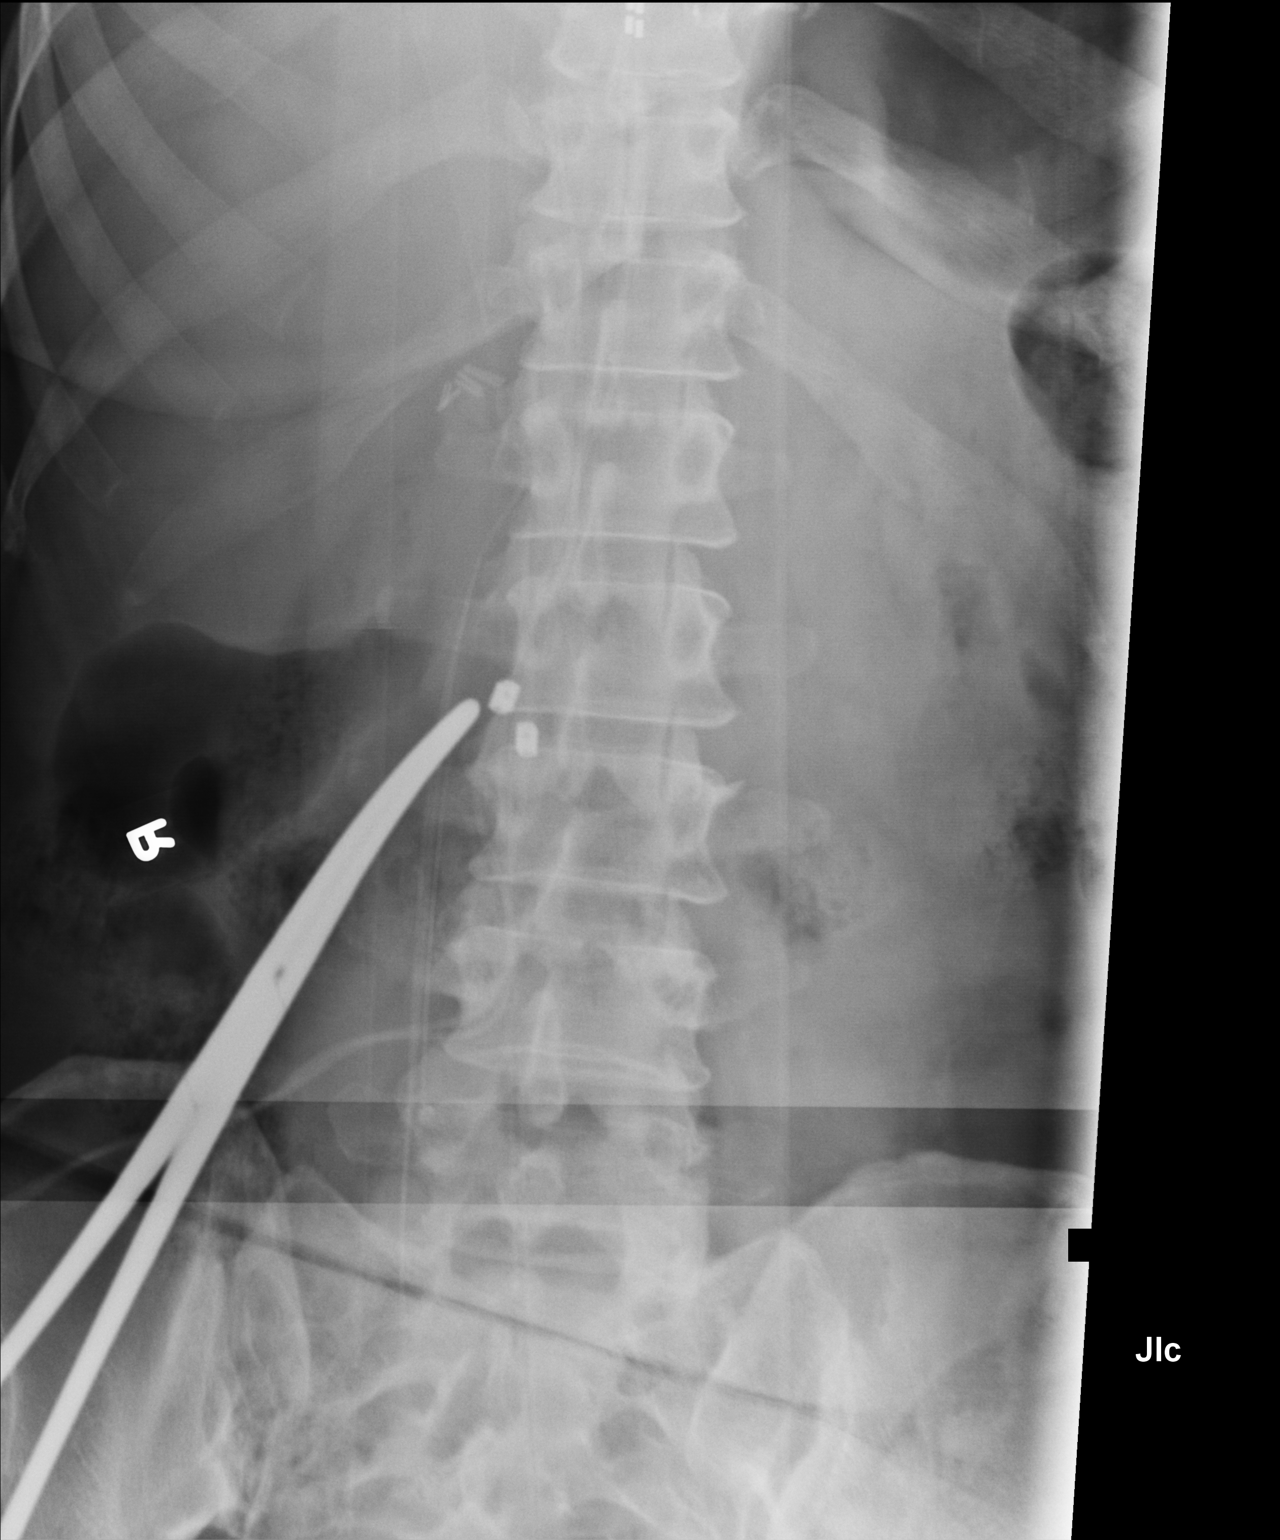

[1 of 1 positions shown; findings below may reference images not displayed]

FINDINGS: The tip of the surgical instrument projects over the right
paraspinal region at the L2-3 disc level. Spinal stimulator remains
in place. It extends from T10 above the superior limit of the study.
IMPRESSION: Intraoperative localization at L2-3.

## 2019-11-12 IMAGING — CT CT ABDOMEN AND PELVIS WITH CONTRAST
2 of 5 series · 17 of 46 positions shown, 19 images · IV contrast (APPLIED)
Comparison: None.

CLINICAL DATA: Central abdominal pain for 2 days

EXAM:
CT ABDOMEN AND PELVIS WITH CONTRAST
TECHNIQUE: Multidetector CT imaging of the abdomen and pelvis was performed
using the standard protocol following bolus administration of
intravenous contrast.
CONTRAST:  100mL OMNIPAQUE IOHEXOL 300 MG/ML  SOLN

[Series 3: abdomen 5.0 · axial · 0.88mm/px · z∈[+734,+1159]mm · 14 of 97 slices shown, 16 images]
[im 6/97  soft-tissue]
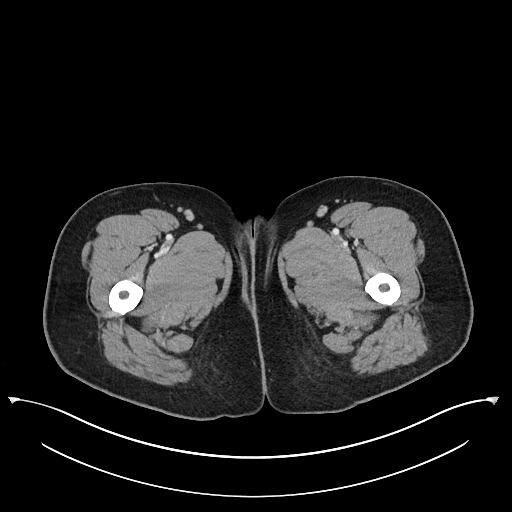
[im 6/97  bone]
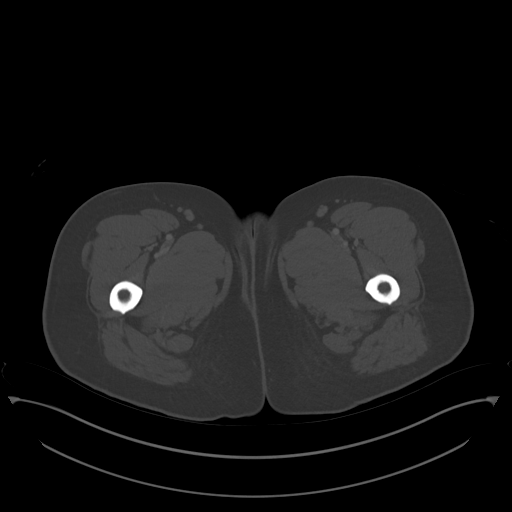
[im 12/97  soft-tissue]
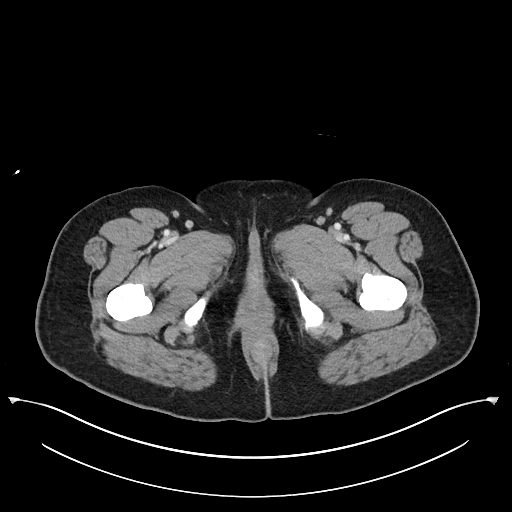
[im 17/97  soft-tissue]
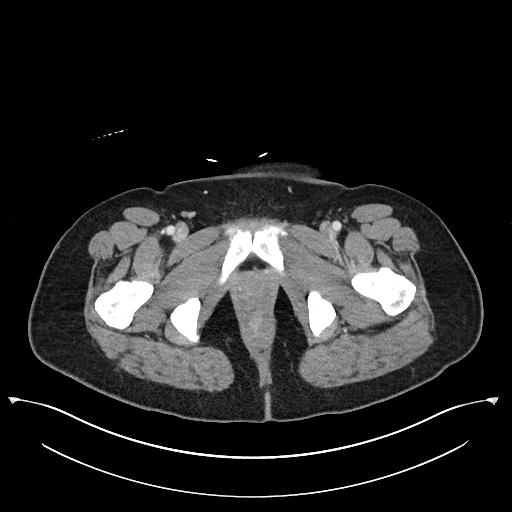
[im 29/97  soft-tissue]
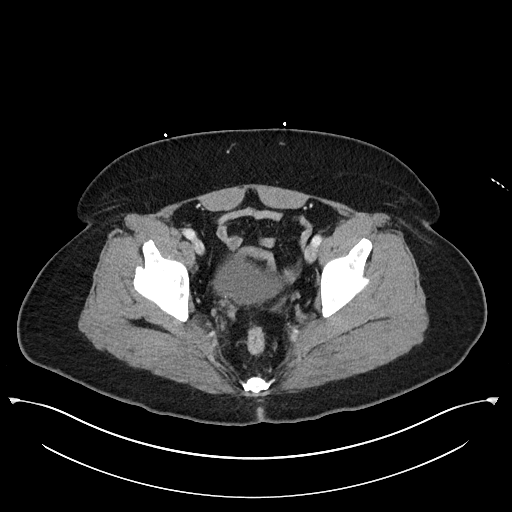
[im 34/97  soft-tissue]
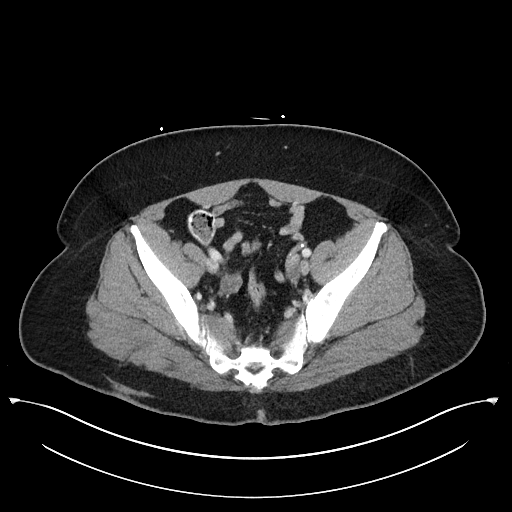
[im 40/97  soft-tissue]
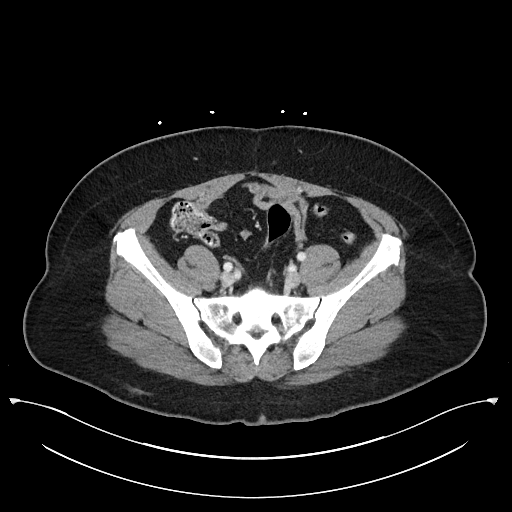
[im 46/97  soft-tissue]
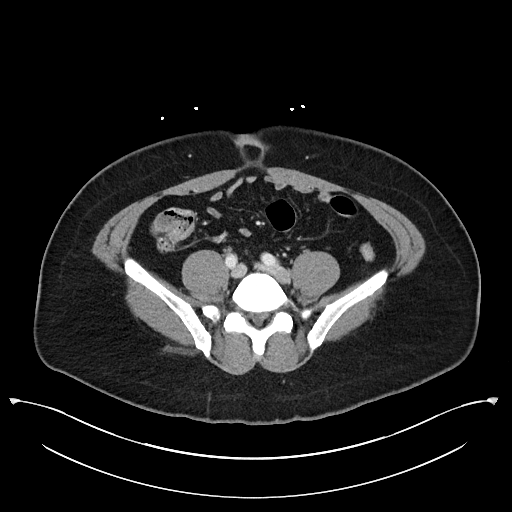
[im 51/97  soft-tissue]
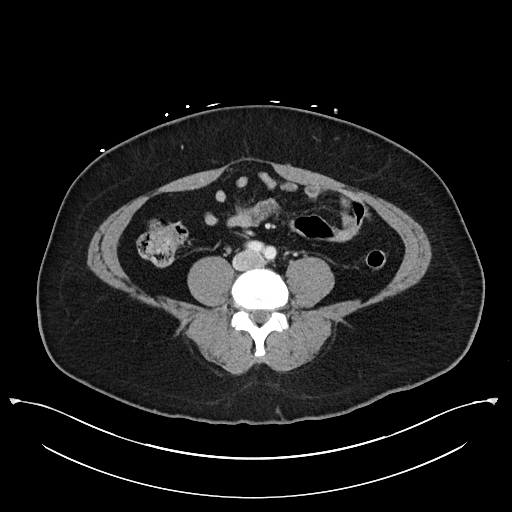
[im 57/97  soft-tissue]
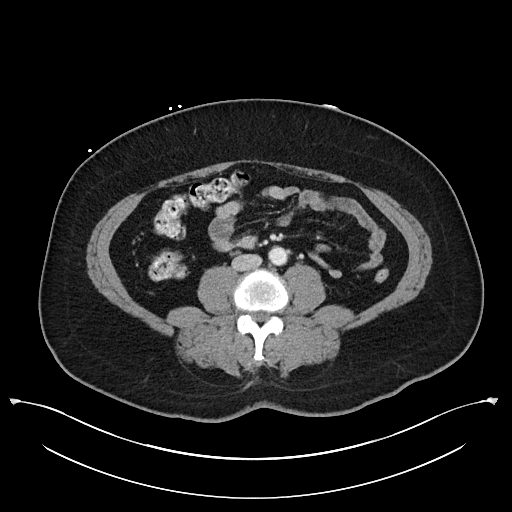
[im 57/97  bone]
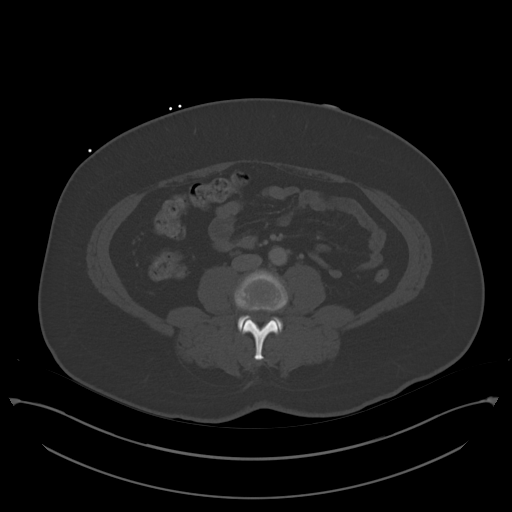
[im 63/97  soft-tissue]
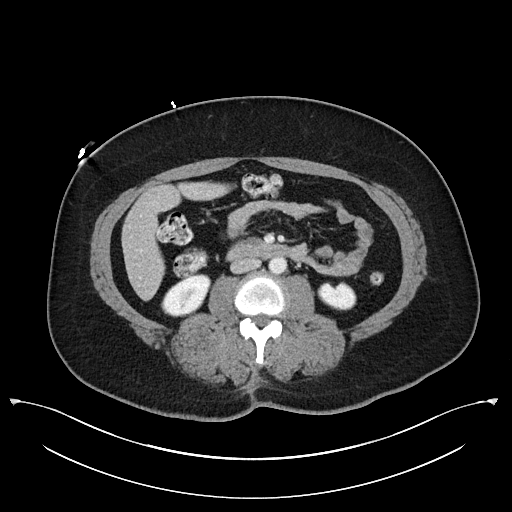
[im 74/97  soft-tissue]
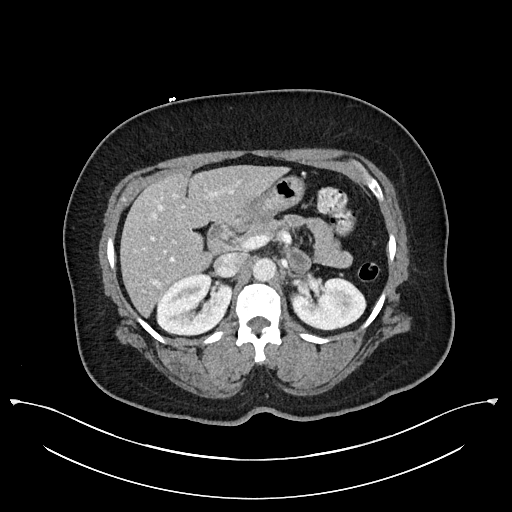
[im 80/97  soft-tissue]
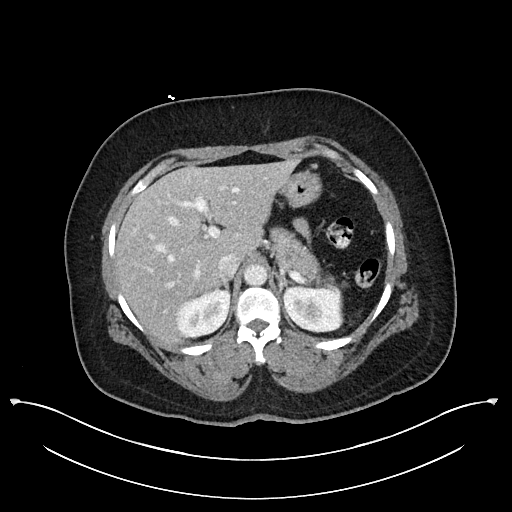
[im 85/97  soft-tissue]
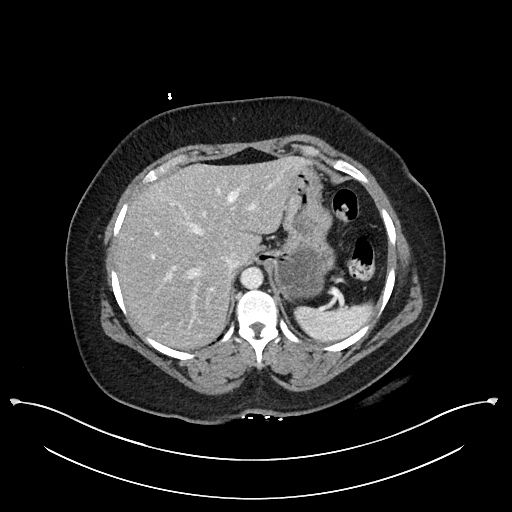
[im 91/97  soft-tissue]
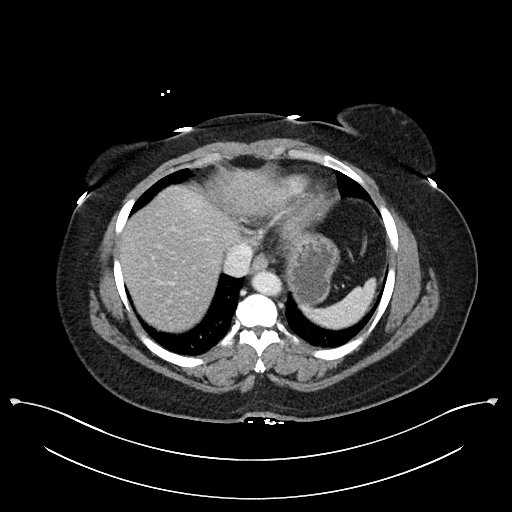

[Series 6: abdomen 3.0 mpr cor · coronal · 0.93mm/px · 3 of 103 slices shown]
[im 35/103  soft-tissue]
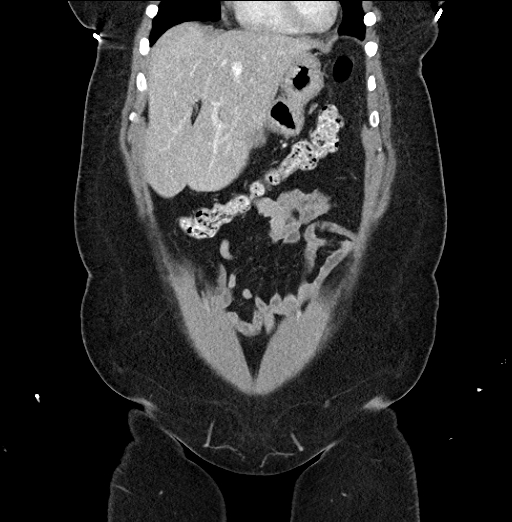
[im 46/103  soft-tissue]
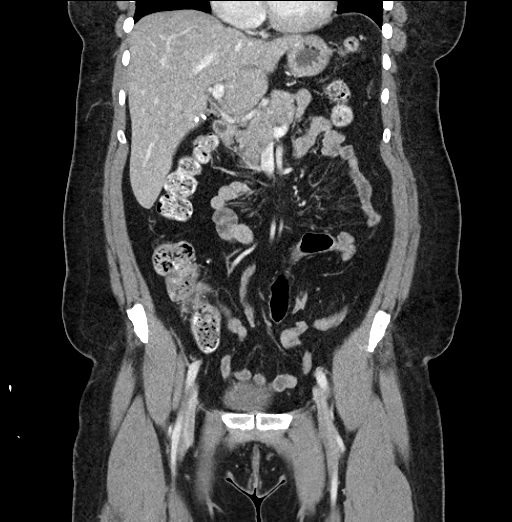
[im 57/103  soft-tissue]
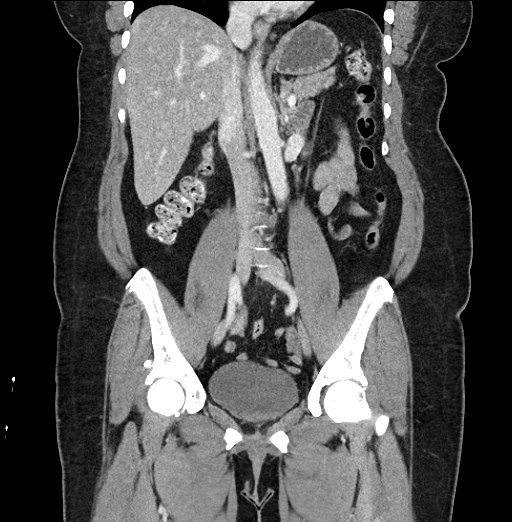

[17 of 46 positions shown; findings below may reference images not displayed]

FINDINGS: Lower chest: Mild dependent atelectatic changes are noted. No focal
confluent infiltrate is seen.

Hepatobiliary: Fatty infiltration of the liver is noted. The
gallbladder has been surgically removed.

Pancreas: Unremarkable. No pancreatic ductal dilatation or
surrounding inflammatory changes.

Spleen: Normal in size without focal abnormality.

Adrenals/Urinary Tract: Adrenal glands are within normal limits.
Kidneys demonstrate a normal enhancement pattern bilaterally. No
renal calculi or obstructive changes are seen. Delayed images
demonstrate normal excretion of contrast material. The bladder is
well distended.

Stomach/Bowel: The appendix is within normal limits. No obstructive
or inflammatory changes of the colon or small bowel are seen. The
stomach is within normal limits.

Vascular/Lymphatic: No significant vascular findings are present. No
enlarged abdominal or pelvic lymph nodes.

Reproductive: Status post hysterectomy. No adnexal masses.

Other: No abdominal wall hernia or abnormality. No abdominopelvic
ascites.

Musculoskeletal: Bony structures demonstrate degenerative change of
the lumbar spine. No acute abnormality is noted.
IMPRESSION: Chronic changes without acute abnormality.

## 2020-04-16 NOTE — Progress Notes (Signed)
Office Visit Note  Patient: [name]             Date of Birth: Jul 25, 1966           MRN: [id number]             PCP: Ann Maki, DO Referring: Daryll Brod, MD Visit Date: 04/20/2020 Occupation: '@GUAROCC' @  Subjective:  Pain in both hands.   History of Present Illness: [name] is a 54 y.o. female seen in consultation per request of Dr. Fredna Dow.  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  In 2006 she underwent bilateral carpal tunnel release.  She states after that she started experiencing increased pain and knots in her hands.  She continued to have numbness in her hands.  She states she is unable to use her hands.  Her fingers lock up on her.  She has been also having discomfort in her left elbow joint for which she was seeing an orthopedic surgeon at [facility].  She recently had MRI but the results are pending.  She had bilateral Achilles tendon repair last year.  She had right rotator cuff tear repair in 2020.  She also had a spinal cord stim unit removed.  She has chronic lower back pain.  She states she is a scheduled to have right knee joint meniscal tear repair tomorrow.  Activities of Daily Living:  Patient reports morning stiffness for 24  hours.   Patient Reports nocturnal pain.  Difficulty dressing/grooming: Reports Difficulty climbing stairs: Reports Difficulty getting out of chair: Reports Difficulty using hands for taps, buttons, cutlery, and/or writing: Reports  Review of Systems  Constitutional: Positive for fatigue.  HENT: Negative for mouth sores, mouth dryness and nose dryness.   Eyes: Negative for itching and dryness.  Respiratory: Negative for shortness of breath and difficulty breathing.   Cardiovascular: Negative for chest pain and palpitations.  Gastrointestinal: Positive for constipation. Negative for blood in stool and diarrhea.  Endocrine: Negative for increased urination.  Genitourinary:  Negative for difficulty urinating.  Musculoskeletal: Positive for arthralgias, joint pain, joint swelling, myalgias, morning stiffness, muscle tenderness and myalgias.  Skin: Negative for color change, rash and redness.  Allergic/Immunologic: Negative for susceptible to infections.  Neurological: Positive for numbness, headaches and weakness. Negative for dizziness and memory loss.  Hematological: Negative for bruising/bleeding tendency.  Psychiatric/Behavioral: Positive for sleep disturbance. Negative for confusion.    PMFS History:  Patient Active Problem List   Diagnosis Date Noted  . Chronic foot pain 08/26/2015  . Night sweats 02/04/2015  . Atypical chest pain 02/04/2015  . Myalgia 04/10/2014  . Headache(784.0) 04/03/2014  . Insomnia 04/03/2014  . Obesity 04/03/2014  . Hyperlipidemia 03/03/2014  . Arthritis 03/03/2014  . Seasonal allergies 03/03/2014    Past Medical History:  Diagnosis Date  . Arthritis   . Chronic back pain   . Depression   . GERD (gastroesophageal reflux disease)   . Hyperlipidemia 03/03/2014  . Neuropathy     Family History  Problem Relation Age of Onset  . Hypertension Mother   . Hypertension Father   . Diabetes Father   . Cancer Paternal Grandmother        unsure  . Cancer Paternal Grandfather        prostate  . Diabetes type II Daughter   . Healthy Son    Past Surgical History:  Procedure Laterality Date  . ABDOMINAL HYSTERECTOMY  partial-still has ovaries. Benign reason. Unsure about cervix   . ACHILLES TENDON SURGERY Bilateral    2017   left  . BREAST SURGERY     nodule in breast removed-not cancer  . CHOLECYSTECTOMY    . COLONOSCOPY  2018  . NEUROPLASTY / TRANSPOSITION MEDIAN NERVE AT CARPAL TUNNEL BILATERAL Bilateral   . SPINAL CORD STIMULATOR INSERTION  07/26/2017   Procedure: LUMBAR SPINAL CORD STIMULATOR INSERTION;  Surgeon: Clydell Hakim, MD;  Location: [place];  Service: Neurosurgery;;  . SPINAL CORD STIMULATOR REMOVAL N/A  08/16/2018   Procedure: LUMBAR SPINAL CORD STIMULATOR REMOVAL;  Surgeon: Clydell Hakim, MD;  Location: [place];  Service: Neurosurgery;  Laterality: N/A;   Social History   Social History Narrative   Married 21 years in 2015. Lives with 5 people. 2 kids. No grandkids. Husband is a Theme park manager. 3 pregnancies and 2 live births   Owns daycare for 8 years, drive a bus, and Control and instrumentation engineer for [employer], basketball, walking, reading Bible, people person   Kennon Holter MD [facility] past MD-out of network now.             Immunization History  Administered Date(s) Administered  . DTaP 01/10/1967, 08/28/1967, 10/30/1967, 05/12/1976  . Hepatitis A 07/25/2013  . Hepatitis B 07/25/2013  . IPV 01/10/1967, 08/28/1967, 10/30/1967, 05/12/1976  . Influenza,inj,Quad PF,6+ Mos 10/01/2014  . Influenza-Unspecified 11/29/2012  . MMR 04/11/1994  . PFIZER SARS-COV-2 Vaccination 11/09/2019, 12/02/2019  . PPD Test 01/18/2015  . Td 04/11/1994, 11/15/2011  . Tdap 11/09/2011  . Varicella 02/14/2013     Objective: Vital Signs: BP 137/88 (BP Location: Right Arm, Patient Position: Sitting, Cuff Size: Normal)   Pulse 94   Resp 16   Ht '5\' 3"'  (1.6 m)   Wt 199 lb (90.3 kg)   BMI 35.25 kg/m    Physical Exam Vitals and nursing note reviewed.  Constitutional:      Appearance: She is well-developed.  HENT:     Head: Normocephalic and atraumatic.  Eyes:     Conjunctiva/sclera: Conjunctivae normal.  Cardiovascular:     Rate and Rhythm: Normal rate and regular rhythm.     Heart sounds: Normal heart sounds.  Pulmonary:     Effort: Pulmonary effort is normal.     Breath sounds: Normal breath sounds.  Abdominal:     General: Bowel sounds are normal.     Palpations: Abdomen is soft.  Musculoskeletal:     Cervical back: Normal range of motion.  Lymphadenopathy:     Cervical: No cervical adenopathy.  Skin:    General: Skin is warm and dry.     Capillary Refill: Capillary refill  takes less than 2 seconds.  Neurological:     Mental Status: She is alert and oriented to person, place, and time.  Psychiatric:        Behavior: Behavior normal.      Musculoskeletal Exam: She has limited range of motion of her cervical and lumbar spine.  She has limited range of motion of her shoulder joints.  Elbow joints with good range of motion.  Wrist joints with good range of motion with no synovitis.  She had hyperalgesia on palpation of her hands.  No sclerodactyly was noted.  She has mild PIP and DIP thickening with mucinous cyst present over some of her DIPs.  Hip joints were in good range of motion.  Knee joints with good range of motion without any warmth.  No synovitis was  noted over MTPs or PIPs.  CDAI Exam: CDAI Score: -- Patient Global: --; Provider Global: -- Swollen: --; Tender: -- Joint Exam 04/20/2020   No joint exam has been documented for this visit   There is currently no information documented on the homunculus. Go to the Rheumatology activity and complete the homunculus joint exam.  Investigation: No additional findings.  Imaging: No results found.  Recent Labs: Lab Results  Component Value Date   WBC 4.4 10/14/2018   HGB 13.8 10/14/2018   PLT 287 10/14/2018   NA 141 10/14/2018   K 3.5 10/14/2018   CL 108 10/14/2018   CO2 23 10/14/2018   GLUCOSE 113 (H) 10/14/2018   BUN 19 10/14/2018   CREATININE 0.86 10/14/2018   BILITOT 0.8 10/14/2018   ALKPHOS 55 10/14/2018   AST 28 10/14/2018   ALT 31 10/14/2018   PROT 8.1 10/14/2018   ALBUMIN 4.7 10/14/2018   CALCIUM 10.3 10/14/2018   GFRAA >60 10/14/2018    Speciality Comments: No specialty comments available.  Procedures:  No procedures performed Allergies: Darvocet [propoxyphene n-acetaminophen], Percocet [oxycodone-acetaminophen], Shrimp [shellfish allergy], Fentanyl, Gabapentin, and Latex   Assessment / Plan:     Visit Diagnoses: Pain in both hands -patient complains of severe pain and  discomfort in her bilateral hands.  She has hyperalgesia.  No synovitis was noted.  She has mild PIP and DIP thickening with mucinous cyst present over DIPs.  The clinical findings are consistent with osteoarthritis.  There is no family history of autoimmune disease.  Plan: XR Hand 2 View Right, XR Hand 2 View Left, x-ray findings are consistent with osteoarthritis.  Uric acid, Rheumatoid factor, Cyclic citrul peptide antibody, IgG, ANA, Sedimentation rate  Chronic pain of right knee-patient states that she has been diagnosed with meniscal tear injury and she has surgery scheduled tomorrow.  DDD (degenerative disc disease), cervical-she has limited range of motion of her cervical spine.  She states she gets cortisone injections to her neck frequently.  DDD (degenerative disc disease), lumbar-she gives history of severe disc disease of lumbar spine and has been followed by back specialist.    Myalgia -she complains of generalized muscle pain.  Plan: CK  Myofascial pain-she appears to have a component of myofascial pain with generalized hyperalgesia positive tender points.  Other medical problems are listed as follows:  History of hyperlipidemia  Other insomnia  Night sweats  Educated about COVID-19 virus infection-she is fully immunized against COVID-19.  Use of booster was discussed when available to her.  Use of mask, social distancing and hand hygiene was discussed.  Orders: Orders Placed This Encounter  Procedures  . XR Hand 2 View Right  . XR Hand 2 View Left  . Uric acid  . Rheumatoid factor  . Cyclic citrul peptide antibody, IgG  . ANA  . Sedimentation rate  . CK   No orders of the defined types were placed in this encounter.   Follow-Up Instructions: Return for Pain in both hands.   Bo Merino, MD  Note - This record has been created using Editor, commissioning.  Chart creation errors have been sought, but may not always  have been located. Such creation errors do  not reflect on  the standard of medical care.

## 2020-04-20 ENCOUNTER — Encounter (INDEPENDENT_AMBULATORY_CARE_PROVIDER_SITE_OTHER): Payer: Self-pay

## 2020-04-20 ENCOUNTER — Encounter: Payer: Self-pay | Admitting: Rheumatology

## 2020-04-20 ENCOUNTER — Ambulatory Visit: Payer: Self-pay

## 2020-04-20 ENCOUNTER — Ambulatory Visit (INDEPENDENT_AMBULATORY_CARE_PROVIDER_SITE_OTHER): Payer: Medicare PPO | Admitting: Rheumatology

## 2020-04-20 ENCOUNTER — Other Ambulatory Visit: Payer: Self-pay

## 2020-04-20 VITALS — BP 137/88 | HR 94 | Resp 16 | Ht 63.0 in | Wt 199.0 lb

## 2020-04-20 DIAGNOSIS — M5030 Other cervical disc degeneration, unspecified cervical region: Secondary | ICD-10-CM

## 2020-04-20 DIAGNOSIS — M791 Myalgia, unspecified site: Secondary | ICD-10-CM

## 2020-04-20 DIAGNOSIS — G4709 Other insomnia: Secondary | ICD-10-CM

## 2020-04-20 DIAGNOSIS — R61 Generalized hyperhidrosis: Secondary | ICD-10-CM

## 2020-04-20 DIAGNOSIS — M5136 Other intervertebral disc degeneration, lumbar region: Secondary | ICD-10-CM

## 2020-04-20 DIAGNOSIS — Z8639 Personal history of other endocrine, nutritional and metabolic disease: Secondary | ICD-10-CM

## 2020-04-20 DIAGNOSIS — M25561 Pain in right knee: Secondary | ICD-10-CM

## 2020-04-20 DIAGNOSIS — G8929 Other chronic pain: Secondary | ICD-10-CM

## 2020-04-20 DIAGNOSIS — M7918 Myalgia, other site: Secondary | ICD-10-CM

## 2020-04-20 DIAGNOSIS — M79641 Pain in right hand: Secondary | ICD-10-CM

## 2020-04-20 DIAGNOSIS — M79642 Pain in left hand: Secondary | ICD-10-CM

## 2020-04-20 DIAGNOSIS — Z7189 Other specified counseling: Secondary | ICD-10-CM

## 2020-04-21 HISTORY — PX: KNEE ARTHROSCOPY: SUR90

## 2020-04-22 LAB — SEDIMENTATION RATE: Sed Rate: 6 mm/h (ref 0–30)

## 2020-04-22 LAB — ANTI-NUCLEAR AB-TITER (ANA TITER): ANA Titer 1: 1:80 {titer} — ABNORMAL HIGH

## 2020-04-22 LAB — URIC ACID: Uric Acid, Serum: 4.2 mg/dL (ref 2.5–7.0)

## 2020-04-22 LAB — CYCLIC CITRUL PEPTIDE ANTIBODY, IGG: Cyclic Citrullin Peptide Ab: <16 UNITS

## 2020-04-22 LAB — ANA: Anti Nuclear Antibody (ANA): POSITIVE — AB

## 2020-04-22 LAB — RHEUMATOID FACTOR: Rheumatoid fact SerPl-aCnc: <14 IU/mL (ref <14)

## 2020-04-22 LAB — CK: Total CK: 175 U/L — ABNORMAL HIGH (ref 29–143)

## 2020-05-12 ENCOUNTER — Telehealth: Payer: Self-pay

## 2020-05-12 DIAGNOSIS — R768 Other specified abnormal immunological findings in serum: Secondary | ICD-10-CM

## 2020-05-12 NOTE — Telephone Encounter (Signed)
Attempted to contact the patient and left message for patient to call the office.  

## 2020-05-12 NOTE — Telephone Encounter (Signed)
Based on the lab work so far I cannot make a diagnosis of autoimmune disease.  She can use Tylenol for pain.  If it is not sufficient then she may have to go to pain management.  I would recommend adding ENA, C3 and C4 has her ANA is positive.  If she can get these labs now then we will have results available at the follow-up visit.

## 2020-05-12 NOTE — Addendum Note (Signed)
Addended by: Henriette Combs on: 05/12/2020 05:00 PM   Modules accepted: Orders

## 2020-05-12 NOTE — Telephone Encounter (Signed)
Patient states she is having pain and swelling in bilateral hands. Patient states she is having trouble with picking anything up or dressing or caring for herself. Patient states her hands are also numb. Patient would like to know if there is anything she can take until her follow up appointment on 05/26/2020. Please advise.

## 2020-05-12 NOTE — Telephone Encounter (Signed)
Patient left a voicemail yesterday 05/11/20 at 5:05 pm requesting a return call to let her know what Dr. Corliss Skains is going to do about her hands.  Patient states "I'm getting depressed because I cannot deal with the pain in my hands and fingers."

## 2020-05-12 NOTE — Telephone Encounter (Signed)
Patient advised based on the lab work so far Dr. Corliss Skains cannot make a diagnosis of autoimmune disease.  Patient advised she can use Tylenol for pain.  Patient advised if it is not sufficient then she may have to go to pain management.  Patient advised Dr,. Deveshwar would recommend adding ENA, C3 and C4 has her ANA is positive.  If she can get these labs now then we will have results available at the follow-up visit. Patient expressed understanding and lab orders placed.

## 2020-05-13 ENCOUNTER — Other Ambulatory Visit: Payer: Self-pay

## 2020-05-13 DIAGNOSIS — R768 Other specified abnormal immunological findings in serum: Secondary | ICD-10-CM

## 2020-05-14 LAB — SJOGRENS SYNDROME-A EXTRACTABLE NUCLEAR ANTIBODY: SSA (Ro) (ENA) Antibody, IgG: <1 AI

## 2020-05-14 LAB — C3 AND C4
C3 Complement: 156 mg/dL (ref 83–193)
C4 Complement: 29 mg/dL (ref 15–57)

## 2020-05-14 LAB — ANTI-SMITH ANTIBODY: ENA SM Ab Ser-aCnc: <1 AI

## 2020-05-14 LAB — ANTI-DNA ANTIBODY, DOUBLE-STRANDED: ds DNA Ab: <1 IU/mL

## 2020-05-14 LAB — RNP ANTIBODY: Ribonucleic Protein(ENA) Antibody, IgG: <1 AI

## 2020-05-14 LAB — SJOGRENS SYNDROME-B EXTRACTABLE NUCLEAR ANTIBODY: SSB (La) (ENA) Antibody, IgG: <1 AI

## 2020-05-14 LAB — ANTI-SCLERODERMA ANTIBODY: Scleroderma (Scl-70) (ENA) Antibody, IgG: <1 AI

## 2020-05-15 NOTE — Progress Notes (Signed)
Office Visit Note  Patient: [name]             Date of Birth: [birth year]           MRN: [id number]             PCP: Ann Maki, DO Referring: Ann Maki, DO Visit Date: 05/26/2020 Occupation: Retired, worked as Quarry manager and bus driver  Subjective:  Other (bilateral hand pain/weakness )   History of Present Illness: [name] is a 54 y.o. female with history of osteoarthritis and degenerative disc disease.  She states she has severe pain and discomfort in her hands.  She is concerned about some knots coming up on her hands.  She has generalized pain.  She states her left elbow has been very painful.  She has been also having pain and discomfort in her right plantar fascia for which she has been seeing the podiatrist.  She states even her clothing hurts and if somebody touches her is very painful.  Activities of Daily Living:  Patient reports morning stiffness for several hours.   Patient Reports nocturnal pain.  Difficulty dressing/grooming: Reports Difficulty climbing stairs: Reports Difficulty getting out of chair: Reports Difficulty using hands for taps, buttons, cutlery, and/or writing: Reports  Review of Systems  Constitutional: Negative for fatigue.  HENT: Positive for mouth dryness. Negative for mouth sores and nose dryness.   Eyes: Negative for pain, itching and dryness.  Respiratory: Negative for shortness of breath and difficulty breathing.   Cardiovascular: Negative for chest pain and palpitations.  Gastrointestinal: Positive for constipation. Negative for blood in stool and diarrhea.  Endocrine: Negative for increased urination.  Genitourinary: Negative for difficulty urinating.  Musculoskeletal: Positive for arthralgias, joint pain, joint swelling, myalgias, muscle weakness, morning stiffness, muscle tenderness and myalgias.  Skin: Negative for color change and sensitivity to sunlight.  Allergic/Immunologic: Negative for susceptible to infections.    Neurological: Positive for numbness and headaches. Negative for dizziness, memory loss and weakness.  Hematological: Negative for bruising/bleeding tendency.  Psychiatric/Behavioral: Positive for sleep disturbance. Negative for confusion.    PMFS History:  Patient Active Problem List   Diagnosis Date Noted  . Chronic foot pain 08/26/2015  . Night sweats 02/04/2015  . Atypical chest pain 02/04/2015  . Myalgia 04/10/2014  . Headache(784.0) 04/03/2014  . Insomnia 04/03/2014  . Obesity 04/03/2014  . Hyperlipidemia 03/03/2014  . Arthritis 03/03/2014  . Seasonal allergies 03/03/2014    Past Medical History:  Diagnosis Date  . Arthritis   . Chronic back pain   . Depression   . GERD (gastroesophageal reflux disease)   . Hyperlipidemia 03/03/2014  . Neuropathy     Family History  Problem Relation Age of Onset  . Hypertension Mother   . Hypertension Father   . Diabetes Father   . Cancer Paternal Grandmother        unsure  . Cancer Paternal Grandfather        prostate  . Diabetes type II Daughter   . Healthy Son    Past Surgical History:  Procedure Laterality Date  . ABDOMINAL HYSTERECTOMY     partial-still has ovaries. Benign reason. Unsure about cervix   . ACHILLES TENDON SURGERY Bilateral    2017   left  . BREAST SURGERY     nodule in breast removed-not cancer  . CHOLECYSTECTOMY    . COLONOSCOPY  2018  . KNEE ARTHROSCOPY Right 04/21/2020  . NEUROPLASTY / TRANSPOSITION MEDIAN NERVE AT CARPAL TUNNEL BILATERAL Bilateral   .  SPINAL CORD STIMULATOR INSERTION  07/26/2017   Procedure: LUMBAR SPINAL CORD STIMULATOR INSERTION;  Surgeon: Clydell Hakim, MD;  Location: [place] [facility];  Service: Neurosurgery;;  . SPINAL CORD STIMULATOR REMOVAL N/A 08/16/2018   Procedure: LUMBAR SPINAL CORD STIMULATOR REMOVAL;  Surgeon: Clydell Hakim, MD;  Location: [facility];  Service: Neurosurgery;  Laterality: N/A;   Social History   Social History Narrative   Married 21 years in 2015. Lives with 5  people. 2 kids. No grandkids. Husband is a Theme park manager. 3 pregnancies and 2 live births   Owns daycare for 8 years, drive a bus, and Control and instrumentation engineer for [employer], basketball, walking, reading Bible, people person   Kennon Holter MD [facility] past MD-out of network now.             Immunization History  Administered Date(s) Administered  . DTaP 01/10/1967, 08/28/1967, 10/30/1967, 05/12/1976  . Hepatitis A 07/25/2013  . Hepatitis B 07/25/2013  . IPV 01/10/1967, 08/28/1967, 10/30/1967, 05/12/1976  . Influenza,inj,Quad PF,6+ Mos 10/01/2014  . Influenza-Unspecified 11/29/2012  . MMR 04/11/1994  . PFIZER SARS-COV-2 Vaccination 11/09/2019, 12/02/2019  . PPD Test 01/18/2015  . Td 04/11/1994, 11/15/2011  . Tdap 11/09/2011  . Varicella 02/14/2013     Objective: Vital Signs: BP 134/86 (BP Location: Right Arm, Patient Position: Sitting, Cuff Size: Normal)   Pulse 91   Resp 16   Ht _0  (1.6 m)   Wt 201 lb 3.2 oz (91.3 kg)   BMI 35.64 kg/m    Physical Exam Vitals and nursing note reviewed.  Constitutional:      Appearance: She is well-developed.  HENT:     Head: Normocephalic and atraumatic.  Eyes:     Conjunctiva/sclera: Conjunctivae normal.  Cardiovascular:     Rate and Rhythm: Normal rate and regular rhythm.     Heart sounds: Normal heart sounds.  Pulmonary:     Effort: Pulmonary effort is normal.     Breath sounds: Normal breath sounds.  Abdominal:     General: Bowel sounds are normal.     Palpations: Abdomen is soft.  Musculoskeletal:     Cervical back: Normal range of motion.  Lymphadenopathy:     Cervical: No cervical adenopathy.  Skin:    General: Skin is warm and dry.     Capillary Refill: Capillary refill takes less than 2 seconds.  Neurological:     Mental Status: She is alert and oriented to person, place, and time.  Psychiatric:        Behavior: Behavior normal.      Musculoskeletal Exam: Patient mobilizes with the help of a  cane.  She has limited and painful range of motion of her entire spine.  Shoulder joints were in good range of motion.  She has discomfort range of motion of her left elbow.  She had no synovitis over MCPs PIPs or DIPs.  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  Hip joints was difficult to assess during the sitting position.  Knee joints with good range of motion without any warmth or swelling.  There was no tenderness over ankle joints or swelling.  No tenderness across MTPs was noted.  She had generalized hyperalgesia and positive tender points.  CDAI Exam: CDAI Score: -- Patient Global: --; Provider Global: -- Swollen: --; Tender: -- Joint Exam 05/26/2020   No joint exam has been documented for this visit   There is currently no information documented on the homunculus. Go to the Rheumatology  activity and complete the homunculus joint exam.  Investigation: No additional findings.  Imaging: No results found.  Recent Labs: Lab Results  Component Value Date   WBC 4.4 10/14/2018   HGB 13.8 10/14/2018   PLT 287 10/14/2018   NA 141 10/14/2018   K 3.5 10/14/2018   CL 108 10/14/2018   CO2 23 10/14/2018   GLUCOSE 113 (H) 10/14/2018   BUN 19 10/14/2018   CREATININE 0.86 10/14/2018   BILITOT 0.8 10/14/2018   ALKPHOS 55 10/14/2018   AST 28 10/14/2018   ALT 31 10/14/2018   PROT 8.1 10/14/2018   ALBUMIN 4.7 10/14/2018   CALCIUM 10.3 10/14/2018   GFRAA >60 10/14/2018   April 20, 2020 ANA 1: 80 speckled, ESR 6, CK 175, RF negative, anti-CCP negative, uric acid 4.2 May 13, 2020 ENA negative, C3-C4 normal  Speciality Comments: No specialty comments available.  Procedures:  No procedures performed Allergies: Darvocet [propoxyphene n-acetaminophen], Percocet [oxycodone-acetaminophen], Shrimp [shellfish allergy], Fentanyl, Gabapentin, and Latex   Assessment / Plan:     Visit Diagnoses: Positive ANA (antinuclear antibody) - ANA 1: 80 speckled, ENA negative, C3-C4 normal.   Patient had no clinical features of lupus.  No further work-up is needed.  Labs were discussed with the patient at length.  Primary osteoarthritis of both hands - Clinical and radiographic findings are consistent with osteoarthritis.  All autoimmune work-up was negative.  She complains of lot of pain and discomfort in her hands.  She also has mucinous cyst on her hands.  She is concerned about the mucinous cyst.  Have advised her to discuss that further with Dr. Fredna Dow.- Plan: Ambulatory referral to Occupational Therapy  Chronic pain of right knee -she continues to have discomfort in her right knee joint.  No synovitis was noted.  History of meniscal tear  DDD (degenerative disc disease), cervical - Patient had limited range of motion of cervical spine.  She gets cortisone injections in the C-spine per patient.  DDD (degenerative disc disease), lumbar - Followed by a pain specialist per patient.  Fibromyalgia - Generalized pain, hyperalgesia, positive tender points.  She continues to have a lot of discomfort from fibromyalgia.  Good sleep hygiene and regular exercise was emphasized.  Other insomnia  Night sweats  History of hyperlipidemia  Orders: Orders Placed This Encounter  Procedures  . Ambulatory referral to Occupational Therapy   No orders of the defined types were placed in this encounter.    Follow-Up Instructions: Return if symptoms worsen or fail to improve, for Osteoarthritis.   Bo Merino, MD  Note - This record has been created using Editor, commissioning.  Chart creation errors have been sought, but may not always  have been located. Such creation errors do not reflect on  the standard of medical care.

## 2020-05-26 ENCOUNTER — Ambulatory Visit (INDEPENDENT_AMBULATORY_CARE_PROVIDER_SITE_OTHER): Payer: Medicare PPO | Admitting: Rheumatology

## 2020-05-26 ENCOUNTER — Other Ambulatory Visit: Payer: Self-pay

## 2020-05-26 ENCOUNTER — Encounter: Payer: Self-pay | Admitting: Rheumatology

## 2020-05-26 VITALS — BP 134/86 | HR 91 | Resp 16 | Ht 63.0 in | Wt 201.2 lb

## 2020-05-26 DIAGNOSIS — M5136 Other intervertebral disc degeneration, lumbar region: Secondary | ICD-10-CM

## 2020-05-26 DIAGNOSIS — R768 Other specified abnormal immunological findings in serum: Secondary | ICD-10-CM | POA: Diagnosis not present

## 2020-05-26 DIAGNOSIS — M5030 Other cervical disc degeneration, unspecified cervical region: Secondary | ICD-10-CM

## 2020-05-26 DIAGNOSIS — R61 Generalized hyperhidrosis: Secondary | ICD-10-CM

## 2020-05-26 DIAGNOSIS — M25561 Pain in right knee: Secondary | ICD-10-CM | POA: Diagnosis not present

## 2020-05-26 DIAGNOSIS — M19041 Primary osteoarthritis, right hand: Secondary | ICD-10-CM

## 2020-05-26 DIAGNOSIS — M19042 Primary osteoarthritis, left hand: Secondary | ICD-10-CM

## 2020-05-26 DIAGNOSIS — Z8639 Personal history of other endocrine, nutritional and metabolic disease: Secondary | ICD-10-CM

## 2020-05-26 DIAGNOSIS — G8929 Other chronic pain: Secondary | ICD-10-CM

## 2020-05-26 DIAGNOSIS — M51369 Other intervertebral disc degeneration, lumbar region without mention of lumbar back pain or lower extremity pain: Secondary | ICD-10-CM

## 2020-05-26 DIAGNOSIS — G4709 Other insomnia: Secondary | ICD-10-CM

## 2020-05-26 DIAGNOSIS — R7689 Other specified abnormal immunological findings in serum: Secondary | ICD-10-CM

## 2020-05-26 DIAGNOSIS — M797 Fibromyalgia: Secondary | ICD-10-CM

## 2020-07-14 ENCOUNTER — Ambulatory Visit: Payer: Medicare PPO | Admitting: Occupational Therapy
